# Patient Record
Sex: Male | Born: 1935 | Race: White | Hispanic: No | Smoking: Never smoker
Health system: Southern US, Community
[De-identification: ages and names within clinical notes are randomized; demographics above are authoritative.]

## PROBLEM LIST (undated history)

## (undated) DIAGNOSIS — N189 Chronic kidney disease, unspecified: Secondary | ICD-10-CM

## (undated) DIAGNOSIS — E785 Hyperlipidemia, unspecified: Secondary | ICD-10-CM

## (undated) DIAGNOSIS — C801 Malignant (primary) neoplasm, unspecified: Secondary | ICD-10-CM

## (undated) DIAGNOSIS — M791 Myalgia, unspecified site: Secondary | ICD-10-CM

## (undated) DIAGNOSIS — I10 Essential (primary) hypertension: Secondary | ICD-10-CM

## (undated) DIAGNOSIS — K219 Gastro-esophageal reflux disease without esophagitis: Secondary | ICD-10-CM

## (undated) DIAGNOSIS — I480 Paroxysmal atrial fibrillation: Secondary | ICD-10-CM

## (undated) DIAGNOSIS — H409 Unspecified glaucoma: Secondary | ICD-10-CM

## (undated) DIAGNOSIS — S065XAA Traumatic subdural hemorrhage with loss of consciousness status unknown, initial encounter: Secondary | ICD-10-CM

## (undated) DIAGNOSIS — N183 Chronic kidney disease, stage 3 unspecified: Secondary | ICD-10-CM

## (undated) DIAGNOSIS — Z7901 Long term (current) use of anticoagulants: Secondary | ICD-10-CM

## (undated) DIAGNOSIS — K5790 Diverticulosis of intestine, part unspecified, without perforation or abscess without bleeding: Secondary | ICD-10-CM

## (undated) DIAGNOSIS — N400 Enlarged prostate without lower urinary tract symptoms: Secondary | ICD-10-CM

## (undated) DIAGNOSIS — E119 Type 2 diabetes mellitus without complications: Secondary | ICD-10-CM

## (undated) DIAGNOSIS — R001 Bradycardia, unspecified: Secondary | ICD-10-CM

## (undated) DIAGNOSIS — Z87442 Personal history of urinary calculi: Secondary | ICD-10-CM

## (undated) DIAGNOSIS — C4491 Basal cell carcinoma of skin, unspecified: Secondary | ICD-10-CM

## (undated) DIAGNOSIS — I700 Atherosclerosis of aorta: Secondary | ICD-10-CM

## (undated) DIAGNOSIS — T466X5A Adverse effect of antihyperlipidemic and antiarteriosclerotic drugs, initial encounter: Secondary | ICD-10-CM

## (undated) HISTORY — PX: COLONOSCOPY W/ POLYPECTOMY: SHX1380

## (undated) HISTORY — PX: VASECTOMY: SHX75

## (undated) HISTORY — PX: TONSILLECTOMY: SUR1361

---

## 2003-08-18 ENCOUNTER — Other Ambulatory Visit: Payer: Self-pay

## 2005-10-02 HISTORY — PX: SUBDURAL HEMATOMA EVACUATION VIA CRANIOTOMY: SUR319

## 2006-01-08 ENCOUNTER — Other Ambulatory Visit: Payer: Self-pay

## 2006-01-08 ENCOUNTER — Observation Stay: Payer: Self-pay | Admitting: Internal Medicine

## 2006-05-24 ENCOUNTER — Ambulatory Visit: Payer: Self-pay | Admitting: Family Medicine

## 2006-07-02 ENCOUNTER — Ambulatory Visit: Payer: Self-pay | Admitting: Gastroenterology

## 2009-07-15 ENCOUNTER — Ambulatory Visit: Payer: Self-pay | Admitting: Otolaryngology

## 2009-11-30 ENCOUNTER — Ambulatory Visit: Payer: Self-pay | Admitting: Gastroenterology

## 2010-05-03 ENCOUNTER — Ambulatory Visit: Payer: Self-pay | Admitting: Otolaryngology

## 2010-05-12 ENCOUNTER — Ambulatory Visit: Payer: Self-pay | Admitting: Otolaryngology

## 2010-06-16 ENCOUNTER — Ambulatory Visit: Payer: Self-pay | Admitting: Otolaryngology

## 2011-02-11 ENCOUNTER — Emergency Department: Payer: Self-pay | Admitting: Emergency Medicine

## 2012-06-27 ENCOUNTER — Ambulatory Visit: Payer: Self-pay | Admitting: General Surgery

## 2012-06-27 LAB — BASIC METABOLIC PANEL
BUN: 19 mg/dL — ABNORMAL HIGH (ref 7–18)
Creatinine: 1.24 mg/dL (ref 0.60–1.30)
EGFR (African American): >60
EGFR (Non-African Amer.): 55 — ABNORMAL LOW
Glucose: 128 mg/dL — ABNORMAL HIGH (ref 65–99)
Osmolality: 281 (ref 275–301)
Potassium: 4.3 mmol/L (ref 3.5–5.1)
Sodium: 139 mmol/L (ref 136–145)

## 2012-07-09 ENCOUNTER — Ambulatory Visit: Payer: Self-pay | Admitting: General Surgery

## 2013-01-28 ENCOUNTER — Ambulatory Visit: Payer: Self-pay | Admitting: Family Medicine

## 2013-01-28 ENCOUNTER — Inpatient Hospital Stay: Payer: Self-pay | Admitting: Family Medicine

## 2013-01-29 LAB — COMPREHENSIVE METABOLIC PANEL
Alkaline Phosphatase: 64 U/L (ref 50–136)
Bilirubin,Total: 1.8 mg/dL — ABNORMAL HIGH (ref 0.2–1.0)
Co2: 23 mmol/L (ref 21–32)
EGFR (African American): >60
EGFR (Non-African Amer.): 54 — ABNORMAL LOW
Glucose: 115 mg/dL — ABNORMAL HIGH (ref 65–99)
SGOT(AST): 18 U/L (ref 15–37)
Sodium: 138 mmol/L (ref 136–145)
Total Protein: 6.3 g/dL — ABNORMAL LOW (ref 6.4–8.2)

## 2013-01-29 LAB — CBC WITH DIFFERENTIAL/PLATELET
Basophil #: 0 10*3/uL (ref 0.0–0.1)
Eosinophil #: 0.1 10*3/uL (ref 0.0–0.7)
HCT: 34.9 % — ABNORMAL LOW (ref 40.0–52.0)
Lymphocyte #: 1.5 10*3/uL (ref 1.0–3.6)
MCHC: 34.8 g/dL (ref 32.0–36.0)
MCV: 94 fL (ref 80–100)
Platelet: 97 10*3/uL — ABNORMAL LOW (ref 150–440)
RBC: 3.71 10*6/uL — ABNORMAL LOW (ref 4.40–5.90)
RDW: 13.6 % (ref 11.5–14.5)
WBC: 10.9 10*3/uL — ABNORMAL HIGH (ref 3.8–10.6)

## 2013-01-30 LAB — CBC WITH DIFFERENTIAL/PLATELET
Basophil #: 0 10*3/uL (ref 0.0–0.1)
Basophil %: 0.6 %
Eosinophil #: 0.3 10*3/uL (ref 0.0–0.7)
Eosinophil %: 4.7 %
HCT: 33.8 % — ABNORMAL LOW (ref 40.0–52.0)
HGB: 11.6 g/dL — ABNORMAL LOW (ref 13.0–18.0)
Lymphocyte %: 24.1 %
MCH: 32.6 pg (ref 26.0–34.0)
MCHC: 34.3 g/dL (ref 32.0–36.0)
MCV: 95 fL (ref 80–100)
Monocyte #: 0.6 x10 3/mm (ref 0.2–1.0)
Neutrophil %: 62.4 %
Platelet: 105 10*3/uL — ABNORMAL LOW (ref 150–440)
RDW: 14 % (ref 11.5–14.5)
WBC: 6.9 10*3/uL (ref 3.8–10.6)

## 2013-01-30 LAB — BASIC METABOLIC PANEL
Anion Gap: 4 — ABNORMAL LOW (ref 7–16)
BUN: 16 mg/dL (ref 7–18)
Calcium, Total: 8.1 mg/dL — ABNORMAL LOW (ref 8.5–10.1)
Co2: 25 mmol/L (ref 21–32)
Creatinine: 1.19 mg/dL (ref 0.60–1.30)
Glucose: 81 mg/dL (ref 65–99)
Potassium: 4 mmol/L (ref 3.5–5.1)

## 2013-01-31 LAB — CBC WITH DIFFERENTIAL/PLATELET
Basophil #: 0 10*3/uL (ref 0.0–0.1)
Basophil %: 0.4 %
Eosinophil %: 5.9 %
HCT: 33.9 % — ABNORMAL LOW (ref 40.0–52.0)
HGB: 11.8 g/dL — ABNORMAL LOW (ref 13.0–18.0)
Lymphocyte #: 1.5 10*3/uL (ref 1.0–3.6)
Lymphocyte %: 28.1 %
MCH: 33.1 pg (ref 26.0–34.0)
MCV: 95 fL (ref 80–100)
Monocyte #: 0.5 x10 3/mm (ref 0.2–1.0)
Monocyte %: 8.7 %
Neutrophil #: 3.1 10*3/uL (ref 1.4–6.5)
Platelet: 114 10*3/uL — ABNORMAL LOW (ref 150–440)
RBC: 3.57 10*6/uL — ABNORMAL LOW (ref 4.40–5.90)
WBC: 5.4 10*3/uL (ref 3.8–10.6)

## 2013-01-31 LAB — BASIC METABOLIC PANEL
Anion Gap: 7 (ref 7–16)
Calcium, Total: 8.5 mg/dL (ref 8.5–10.1)
EGFR (African American): >60
EGFR (Non-African Amer.): >60
Sodium: 142 mmol/L (ref 136–145)

## 2014-08-21 DIAGNOSIS — I38 Endocarditis, valve unspecified: Secondary | ICD-10-CM

## 2014-08-21 HISTORY — DX: Endocarditis, valve unspecified: I38

## 2015-01-19 NOTE — Op Note (Signed)
PATIENT NAME:  Craig [name], Craig [name] MR#:  [id number] DATE OF BIRTH:  Jun 22, 1932  DATE OF PROCEDURE:  07/09/2012  PREOPERATIVE DIAGNOSIS: Right inguinal hernia.   POSTOPERATIVE DIAGNOSIS: Right inguinal hernia.   OPERATIVE PROCEDURE: Right indirect inguinal hernia repair with medium Ultrapro mesh.   OPERATING SURGEON: Robert Bellow, MD   ANESTHESIA: General under Dr. Marcene Brawn, Marcaine 0.5% plain, 30 mL local infiltration, Toradol 30 mg.   ESTIMATED BLOOD LOSS: Less than 5 mL.   CLINICAL NOTE: This 81 year old male has developed a symptomatic right inguinal hernia. He was admitted for elective repair. He received Kefzol prior to the procedure. Hair was removed with clippers.   OPERATIVE NOTE: The patient was brought in the operating room and placed in the supine position. He underwent general anesthesia without difficulty. The area was prepped with ChloraPrep and draped. Field block anesthesia was established for postoperative analgesia. A 5 cm skin line incision was made along the anticipated course of the inguinal canal and carried down through the skin and subcutaneous tissue with hemostasis achieved by electrocautery. The external oblique was opened in the direction of its fibers. The ilioinguinal and iliohypogastric nerves were identified and protected. The cremasteric fibers were split and a generous indirect sac was tracked down to the top of the scrotum. This was brought back to the internal ring. There was a small rent in the sac and for that reason the sac was twisted upon itself, transfixed with a 2-0 Vicryl suture ligature followed by a 2-0 Vicryl tie, excised and discarded. The preperitoneal space was cleared and a medium Ultrapro mesh was placed. The external component was laid along the floor of the inguinal canal. It was stitched to the pubic tubercle and then along the inguinal ligament with interrupted 0 Surgilon sutures. The medial and superior borders were anchored to the transverse  abdominis aponeurosis. Toradol was placed into the wound for postoperative analgesia. The external oblique was closed with running 2-0 Vicryl, Scarpa's fascia closed with a running 3-0 Vicryl, and the skin closed with a running 4-0 Vicryl subcuticular suture. Benzoin, Steri-Strips, Telfa, and Tegaderm dressing was then applied.   The patient tolerated the procedure well and was brought to the recovery room in stable condition.   ____________________________ Robert Bellow, MD jwb:drc D: 07/09/2012 10:35:02 ET T: 07/09/2012 10:51:28 ET JOB#: [id number]  cc: Robert Bellow, MD, <Dictator> Milinda Pointer. Jacqualine Code, MD Harriette Tovey Amedeo Kinsman MD ELECTRONICALLY SIGNED 07/15/2012 12:37

## 2015-01-22 NOTE — Consult Note (Signed)
PATIENT NAME:  Craig [name], Craig [name] MR#:  [id number] DATE OF BIRTH:  1932-02-27  DATE OF CONSULT:  01/28/2013  ATTENDING PHYSICIAN:  Harrell Gave A. Pressley Tadesse, MD  REASON FOR CONSULT:  Suprapubic pain x 3 days.   HISTORY OF PRESENT ILLNESS: The patient is a pleasant, 81 year old male with a history of diverticulitis, diabetes and colon polyps, who presented to Dr. Raylene Miyamoto clinic today with suprapubic pain x 3 days. He says that over the last day it has gotten much worse, and he finds himself doubled over with cramping. He also gets the sensation of pressure and need to have a bowel movement, but only passes gas at that time. Does have subjective chills. No fever. Normal appetite. No nausea, vomiting, diarrhea, constipation. Last bowel movement was yesterday. He does have a history of normal bowel movements. He had a CT scan, which showed diverticulitis without intra-abdominal free air. No fevers, chest pain, shortness of breath, cough, dysuria, hematuria. He was admitted from clinic for hospitalization, IV antibiotics, and made n.p.o.   PAST MEDICAL HISTORY:  1.  Diverticulitis, 2 bouts total, last bout 8 to 10 years ago. Has never been admitted for diverticulitis, nor had CT before.  2.  History of colon polyps. Last colonoscopy in 2011 showed diverticulosis, with no obvious polyps. Followup recommendation was to be in 5 years.  3.  Hypertension.  4.  Hyperlipidemia.  5.  Diabetes mellitus.  6.  History of nephrolithiasis.  7.  BPH.   8.  History of traumatic subdural hematoma in 2005.  9.  History of statin-induced myalgia.  10.  History of vasectomy.  11.  History of right face basal cell carcinoma.  12.  History of tonsillectomy.   HOME MEDICATIONS: 1.  Hydrochlorothiazide.  2.  Metformin.  3.  Terazosin.  4.  Lisinopril.  5.  Crestor.  6.  Centrum silver.  7.  Glimepiride.    ALLERGIES:  1.  NIACIN, which causes a rash.  2.  History of INTOLERANCE TO STATINS, except for Crestor.    FAMILY HISTORY:  Relatively negative. No history of heart disease, diabetes or cancer. Mother did have hypertension.   SOCIAL HISTORY:  He is married, lives with his wife. Is retired. No tobacco use. Has 3 to 4 drinks a week. No illegal drug use.   REVIEW OF SYSTEMS: A total of 12-point review of systems was obtained. Pertinent positives and negatives as above.   PHYSICAL EXAMINATION:  VITAL SIGNS:  Temperature 99.6, pulse 77, blood pressure 136/71, respirations 18, 95% on room air.  GENERAL:  No acute distress. Alert and oriented x 3.  EYES:  No scleral icterus. No conjunctivitis.  FACE:  No obvious facial trauma. Normal external nose. Normal external ears.  CHEST:  Lungs clear to auscultation, moving air well.  HEART:  Regular rate and rhythm. No murmurs, rubs or gallops.  ABDOMEN:  Soft, nondistended. Quite tender in suprapubic region, not tender elsewhere in abdomen, plus rebound.  EXTREMITIES:  Moves all extremities well. Strength 5 out of 5.  NEUROLOGIC: Cranial nerves II through XII grossly intact, neurologically intact all 4 extremities.   LABS:  Labs are pending.   CT scan shows a sigmoid diverticulitis with bowel wall thickening and inflammatory changes in the surrounding fat, possible contained perforation as indicated by a little foci of air.   ASSESSMENT AND PLAN: The patient is a pleasant, 81 year old male with recurrent diverticulitis. However, last bout was 8 to 10 years ago. He is up to date on  colonoscopy. Would recommend IV and n.p.o. Will follow up on labs.   Will continue to follow with you. No obvious surgical intervention necessary at this time.    ____________________________ Glena Norfolk Wentworth Edelen, MD cal:mr D: 01/28/2013 18:59:00 ET T: 01/28/2013 19:17:11 ET JOB#: [id number]  cc: Harrell Gave A. Sven Pinheiro, MD, <Dictator> Floyde Parkins MD ELECTRONICALLY SIGNED 01/31/2013 19:18

## 2015-01-22 NOTE — H&P (Signed)
PATIENT NAME:  Craig [name], Craig [name] MR#:  [id number] DATE OF BIRTH:  [birth year]  DATE OF ADMISSION:  01/28/2013  CHIEF COMPLAINT: Lower abdominal pain.   HISTORY OF PRESENT ILLNESS: This is a 81 year old male who presented to the clinic today with complaints of lower abdominal pain x 3 days, acutely worsening over the past day and a half. No fever. No nausea, vomiting, diarrhea or rectal bleeding. His last bowel movement was yesterday. No history of trauma or injury. On exam in the clinic, he was noted to have rebound and guarding and was severely tender in the right lower quadrant. Therefore, he was sent over to radiology for a CT of the abdomen. I spoke to Dr. Orson Ape who stated that the patient has obvious diverticulitis and also foci of air concerning for bowel perforation. After speaking with Dr. Rochel Brome, he recommended hospitalization, IV antibiotics and n.p.o., and surgical evaluation recommended during hospitalization. The patient was admitted from the clinic.   PAST MEDICAL HISTORY:  1.  Hypertension.  2.  Hyperlipidemia.  3.  Adult-onset diabetes.  4.  History of nephrolithiasis.  5.  BPH.  6.  History of traumatic subdural hematoma in 2005.  7.  Statin-induced myalgia.  8.  History of adenomatous polyp.  9.  History of diverticulosis.   PAST SURGICAL HISTORY:  1.  Evacuation of right-sided subdural hematoma in 2005.  2.  Vasectomy.  3.  Right face basal cell carcinoma removal.  4.  Tonsillectomy.   MEDICATIONS:  1.  HCTZ 12.5 mg p.o. daily.  2.  Metformin 500 mg extended-release 1 tab at bedtime.  3.  Terazosin 10 mg p.o. daily.  4.  Lisinopril 20 mg p.o. daily.  5.  Crestor 5 mg 1/2 tab p.o. 3 times a week, Monday, Wednesday, Friday.  6.  Centrum Silver 1 tab daily.  7.  Glimepiride 2 mg p.o. daily.   ALLERGIES: NIACIN, WHICH CAUSES A RASH. HISTORY OF INTOLERANCE TO STATINS, EXCEPT FOR THE CRESTOR.   FAMILY HISTORY: Noncontributory.   SOCIAL HISTORY: He is married and lives  with his wife. He is retired. No tobacco use. No drug use. Occasional alcohol intake. No supplementation.   PREVENTATIVE HEALTH HISTORY. He is up-to-date on his tetanus, April 2013. Up-to-date on his Pneumovax, April. Up-to-date on his influenza, September 2013. Last colonoscopy in March 2011 showed diverticulosis in sigmoid colon per Dr. Candace Cruise with instructions to repeat in 5 years.  CODE STATUS: Full code. POA is Salif Tay, his son, and has a living will in place.   REVIEW OF SYSTEMS: As stated above in HPI.   PHYSICAL EXAMINATION:   VITAL SIGNS: In the office, temperature of 98.5, pulse 90, blood pressure 146/92, height 68 inches, weight 182.4, respiratory rate within normal limits.  GENERAL: This is a well appearing elderly male, alert and oriented x 3, in no apparent distress.  HEENT: Extraocular movements are intact. Moist mucous membranes.  NECK: Supple.  CARDIOVASCULAR: Regular rate and rhythm.  RESPIRATORY: No increased work of breathing. ABDOMEN: Consistent with severe tenderness to palpation of the right lower quadrant and lower mid abdomen with rebound and guarding present.  SKIN: No skin changes noted. Normal color and turgor. NEUROLOGIC: No acute neurological deficits seen.   PERTINENT LABORATORY AND RADIOLOGICAL DATA: The patient had labs performed in the clinic. White blood cell count 12.3, hemoglobin 14.2 and platelet count of 117. Sodium 136, potassium 4.2, creatinine 1.3, glucose 225. AST, ALT all within normal limits, alkaline phosphatase 52 and total bilirubin  1.4. Preliminary CT of the abdomen, as stated above in HPI, did show diverticulitis with concerns for bowel perforation.   ASSESSMENT AND PLAN: This is a 81 year old male being admitted with acute abdominal pain consistent with diverticulitis and concern for a bowel perforation.  1.  Acute abdominal discomfort: The patient has a CT of the abdomen that is consistent with diverticulitis and concern for possible bowel  perforation. After consulting and talking with Dr. Rochel Brome, he did recommend admission but no immediate surgical intervention at this time, awaiting the final read on the CT. The patient is hemodynamically stable and in no distress. Plan for intravenous antibiotics via Zosyn every 6 hours. Also plan on intravenous fluids and nothing by mouth at this time. We will continue with home blood pressure medications as tolerated. Surgery has been consulted during his hospital stay. We will repeat a white blood cell count. Follow vital signs and clinical status. Has Tylenol for fever and will order morphine as needed for pain.  2.  Hypertension: Blood pressure stable. Continue his home blood pressure medications as tolerated.  3.  Hyperlipidemia: Will hold on the Crestor at this time.  4.  Adult-onset diabetes: Will hold the metformin and glimepiride. Recent CT study performed with contrast.  5.  Benign prostatic hypertrophy: Will continue with his home terazosin. 6.  Fluids, electrolytes, nutrition: Nothing by mouth. Place on normal saline 125 mL/hour. Follow electrolytes accordingly. 7.  Prophylaxis: Will place on Lovenox.   DISPOSITION: He is being admitted to the medical floor, inpatient status.   CODE STATUS: He is a full code.   ____________________________ Dion Body, MD kl:jm D: 01/28/2013 16:24:57 ET T: 01/28/2013 17:01:22 ET JOB#: [id number]  cc: Dion Body, MD, <Dictator> Dion Body MD ELECTRONICALLY SIGNED 02/05/2013 16:50

## 2015-01-22 NOTE — Discharge Summary (Signed)
PATIENT NAME:  Craig [name], Craig [name] MR#:  [id number] DATE OF BIRTH:  1931-10-07  DATE OF ADMISSION:  01/28/2013 DATE OF DISCHARGE: 01/31/2013    DISCHARGE DIAGNOSES:  1. Acute diverticulitis.  2. Hypertension.  3. Hyperlipidemia.  4. Adult-onset diabetes.  5. Benign prostatic hypertrophy.   DISCHARGE MEDICATIONS:  1. Hydrochlorothiazide 12.5 mg p.o. daily.  2. Metformin 500 mg extended release 1 tab p.o. with dinner.  3. Terazosin 10 mg p.o. daily.  4. Lisinopril 20 mg p.o. daily.  5. Crestor 5 mg 1/2-tab 3 times a week.  6. Centrum Silver as directed. 7. Glimepiride 2 mg p.o. daily.  8. Amoxicillin 875 p.o. b.i.d. for 9 more days.   CONSULTS: Surgery.   PROCEDURES: None.   PERTINENT STUDIES:  1. CT of the abdomen was consistent with acute diverticulitis with small perforation possible. Seen to have a foci of air in the bowel.  2. On the day of discharge, sodium 142, potassium 4.2, creatinine 1, glucose 104. White blood cell count 5.4, hemoglobin 11.8 and platelets 114.   BRIEF HOSPITAL COURSE:  1. Acute abdominal pain. The patient initially came in with acute lower abdominal pain. CT of the abdomen was consistent with acute diverticulitis, but also was concerning for bowel perforation with a small area of foci of air in the bowel. Surgery was consulted and did not think any surgical intervention was needed. The patient clinically was improving throughout the day, was tolerating p.o. and abdominal pain improved, consistent with treatment of the diverticulitis. He was initially placed on Zosyn for 2 days and transitioned over to Augmentin, which he tolerated very well. He has continued to have some loose stools, but otherwise clinically improved. He will complete a 10-day course of the Augmentin. No further imaging recommended by surgery.  2. Other chronic medical issues remained stable. Continue on home regimen.   DISPOSITION: He is in stable condition and will be discharged to home.    FOLLOWUP: Follow up with Dr. Netty Starring in 10 days.   ____________________________ Dion Body, MD kl:OSi D: 01/31/2013 08:13:30 ET T: 01/31/2013 09:02:00 ET JOB#: [id number]  cc: Dion Body, MD, <Dictator> Dion Body MD ELECTRONICALLY SIGNED 02/05/2013 16:50

## 2015-04-01 IMAGING — CT CT ABD-PELV W/ CM
1 of 2 series · 15 of 32 positions shown, 19 images · IV contrast (isovue)
Comparison: none

REASON FOR EXAM: Metformin Call Report pager [phone]  RLQ abd pain
COMMENTS:

PROCEDURE:     CT  - CT ABDOMEN / PELVIS  W  - January 28, 2013  [DATE]
RESULT:     Comparison:  None
TECHNIQUE: Multiple axial images of the abdomen and pelvis were performed
from the lung bases to the pubic symphysis, with p.o. contrast and with 100
mL of Isovue 300 intravenous contrast.

[Series 2: 3mm soft tissue · axial · 0.76mm/px · z∈[-1010,-575]mm · 15 of 159 slices shown, 19 images]
[im 7/159  soft-tissue]
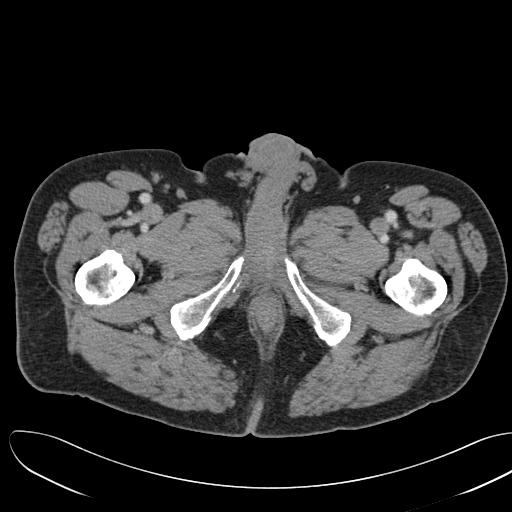
[im 7/159  bone]
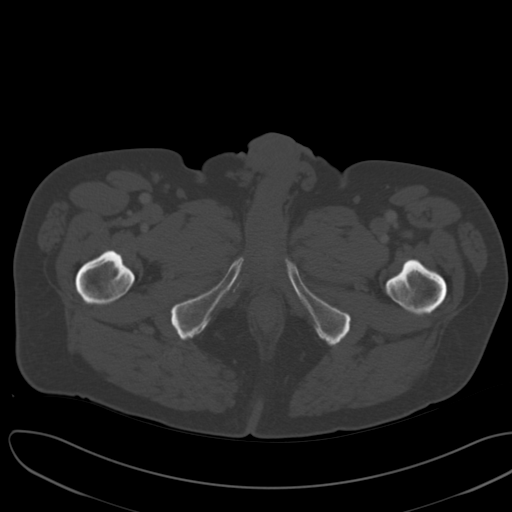
[im 20/159  soft-tissue]
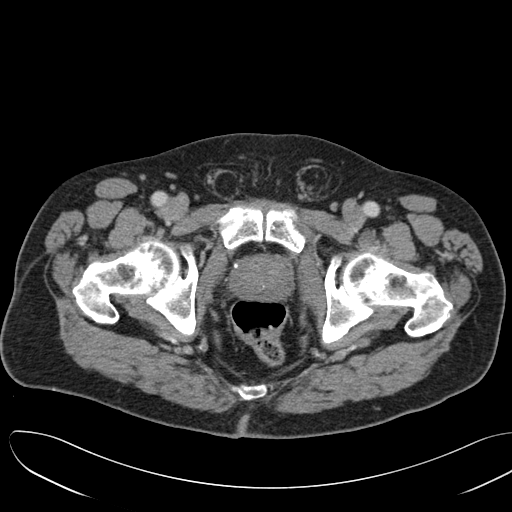
[im 33/159  soft-tissue]
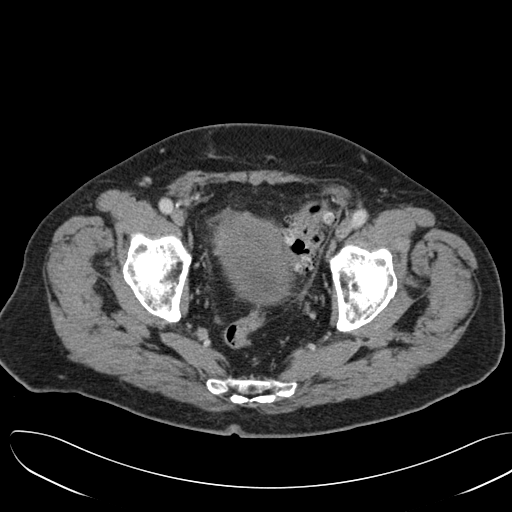
[im 47/159  soft-tissue]
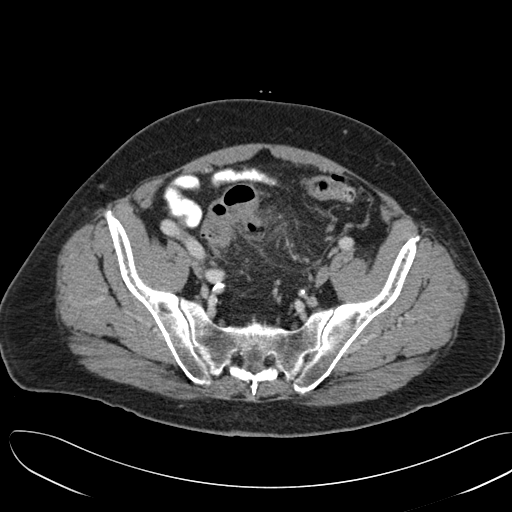
[im 53/159  soft-tissue]
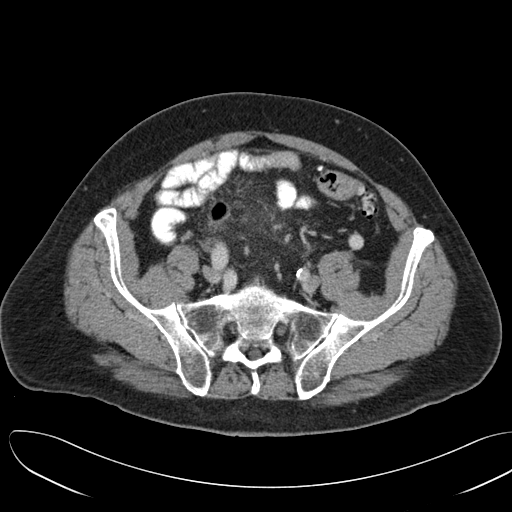
[im 66/159  soft-tissue]
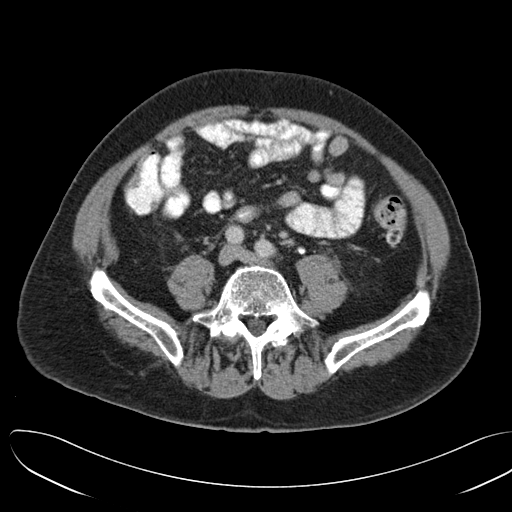
[im 80/159  soft-tissue]
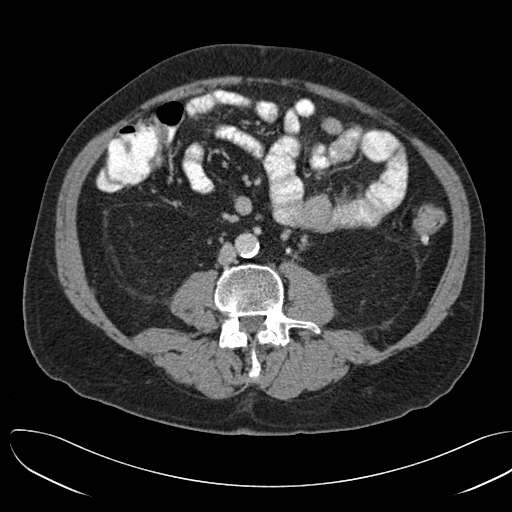
[im 93/159  soft-tissue]
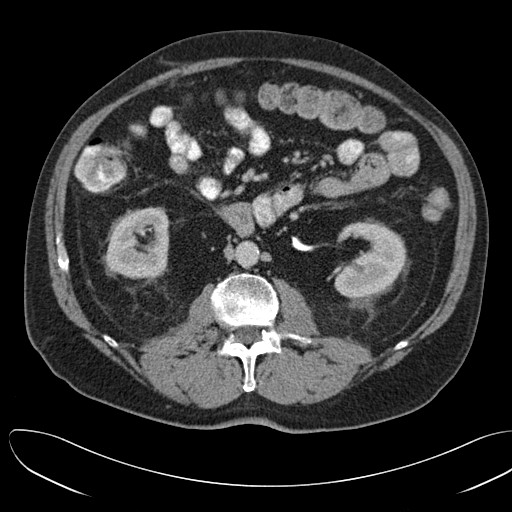
[im 106/159  soft-tissue]
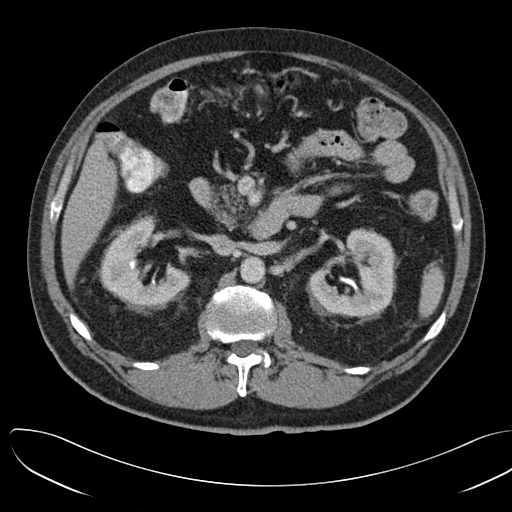
[im 106/159  bone]
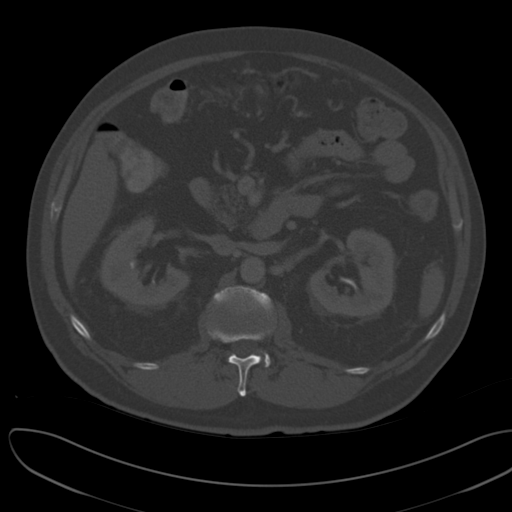
[im 112/159  soft-tissue]
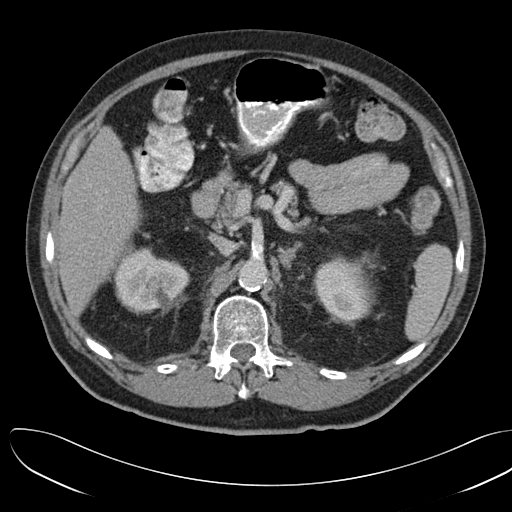
[im 126/159  soft-tissue]
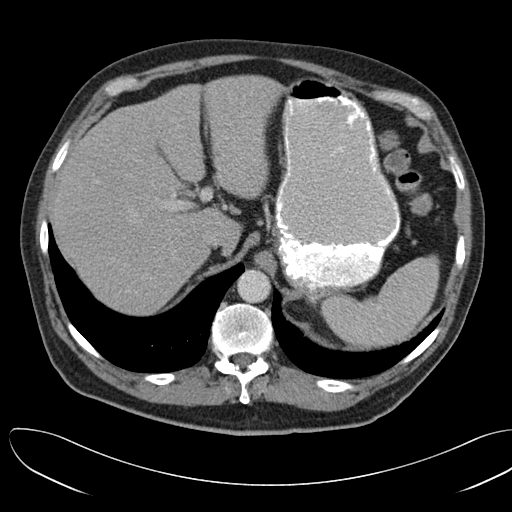
[im 132/159  lung]
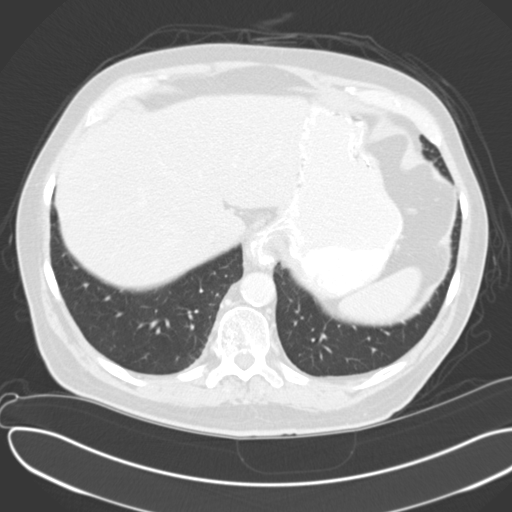
[im 139/159  soft-tissue]
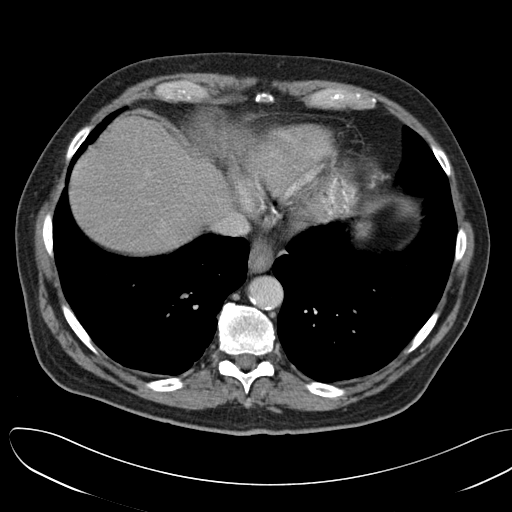
[im 139/159  lung]
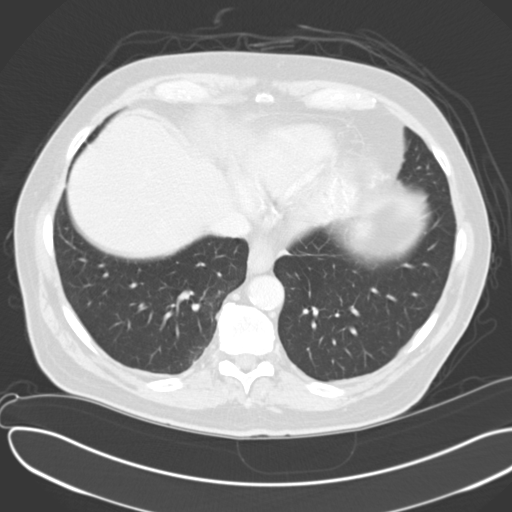
[im 145/159  lung]
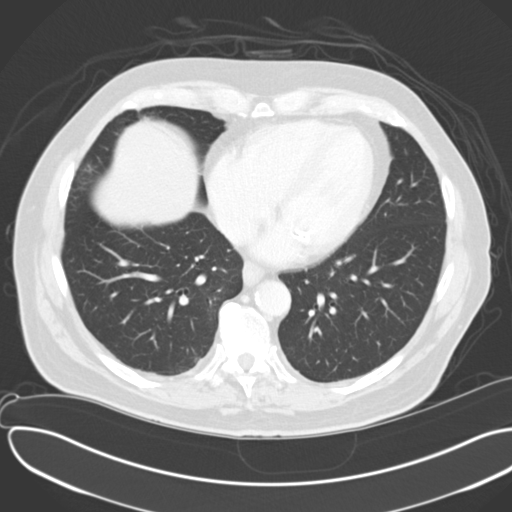
[im 152/159  soft-tissue]
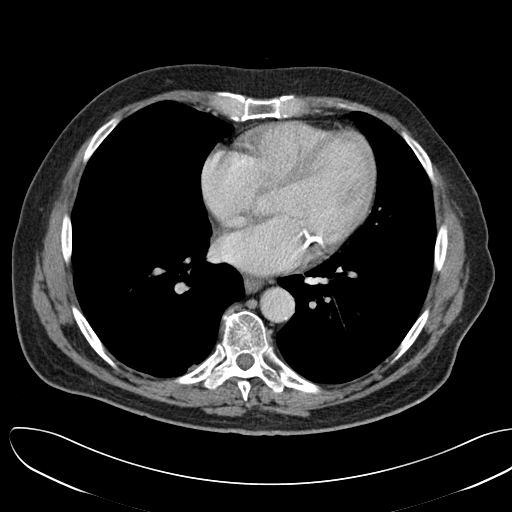
[im 152/159  lung]
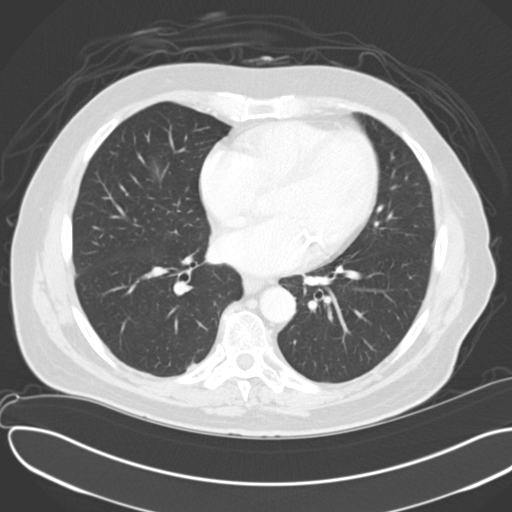

[15 of 32 positions shown; findings below may reference images not displayed]

FINDINGS: The liver, gallbladder, spleen, adrenals, and pancreas are unremarkable.
There is a possible small hiatal hernia. Calcifications are seen in the
coronary arteries.

There is mild perinephric stranding, which is nonspecific. A small amount of
excreted contrast material is seen in the renal collecting systems
bilaterally. Small low-attenuation lesion in the right kidney is too small
to characterize. No hydronephrosis.

The small and large bowel are normal in caliber. There is diverticulosis of
the sigmoid colon. There is bowel wall thickening and mild adjacent
inflammatory changes involving the sigmoid colon. There are a a few small
foci of air adjacent to sigmoid colon which are concerning for contained
perforation versus less likely air within a large diverticulum. No discrete
extraluminal fluid collection identified. There is a mildly prominent degree
of fat within the inguinal canals, left greater than right. There is mild
diverticulosis of the descending colon. The appendix is normal.

No aggressive lytic or sclerotic osseous lesions are identified.
IMPRESSION: There are findings of acute sigmoid diverticulitis. Small foci of adjacent
air are concerning for a small focus of contained perforation versus less
likely air within a large diverticulum. No discrete extraluminal fluid
collection identified.

Colonoscopy is suggested after the acute episode, as malignancy can have a
similar appearance.

This was discussed with Dr. Kaki Jim at 4252 hours 01/28/2013.

## 2015-11-03 DIAGNOSIS — C44311 Basal cell carcinoma of skin of nose: Secondary | ICD-10-CM | POA: Diagnosis not present

## 2015-11-03 DIAGNOSIS — Z79899 Other long term (current) drug therapy: Secondary | ICD-10-CM | POA: Diagnosis not present

## 2015-12-15 DIAGNOSIS — L111 Transient acantholytic dermatosis [Grover]: Secondary | ICD-10-CM | POA: Diagnosis not present

## 2015-12-15 DIAGNOSIS — C44311 Basal cell carcinoma of skin of nose: Secondary | ICD-10-CM | POA: Diagnosis not present

## 2015-12-15 DIAGNOSIS — D485 Neoplasm of uncertain behavior of skin: Secondary | ICD-10-CM | POA: Diagnosis not present

## 2016-01-10 DIAGNOSIS — Z85828 Personal history of other malignant neoplasm of skin: Secondary | ICD-10-CM | POA: Diagnosis not present

## 2016-01-10 DIAGNOSIS — C44311 Basal cell carcinoma of skin of nose: Secondary | ICD-10-CM | POA: Diagnosis not present

## 2016-01-28 DIAGNOSIS — Z85828 Personal history of other malignant neoplasm of skin: Secondary | ICD-10-CM | POA: Diagnosis not present

## 2016-01-28 DIAGNOSIS — C44311 Basal cell carcinoma of skin of nose: Secondary | ICD-10-CM | POA: Diagnosis not present

## 2016-01-28 DIAGNOSIS — X32XXXA Exposure to sunlight, initial encounter: Secondary | ICD-10-CM | POA: Diagnosis not present

## 2016-01-28 DIAGNOSIS — L570 Actinic keratosis: Secondary | ICD-10-CM | POA: Diagnosis not present

## 2016-01-28 DIAGNOSIS — D485 Neoplasm of uncertain behavior of skin: Secondary | ICD-10-CM | POA: Diagnosis not present

## 2016-01-28 DIAGNOSIS — Z08 Encounter for follow-up examination after completed treatment for malignant neoplasm: Secondary | ICD-10-CM | POA: Diagnosis not present

## 2016-02-07 DIAGNOSIS — R1031 Right lower quadrant pain: Secondary | ICD-10-CM | POA: Diagnosis not present

## 2016-02-07 DIAGNOSIS — R1032 Left lower quadrant pain: Secondary | ICD-10-CM | POA: Diagnosis not present

## 2016-02-08 DIAGNOSIS — I10 Essential (primary) hypertension: Secondary | ICD-10-CM | POA: Diagnosis not present

## 2016-02-08 DIAGNOSIS — E119 Type 2 diabetes mellitus without complications: Secondary | ICD-10-CM | POA: Diagnosis not present

## 2016-02-08 DIAGNOSIS — E7800 Pure hypercholesterolemia, unspecified: Secondary | ICD-10-CM | POA: Diagnosis not present

## 2016-02-08 DIAGNOSIS — D693 Immune thrombocytopenic purpura: Secondary | ICD-10-CM | POA: Diagnosis not present

## 2016-02-11 DIAGNOSIS — N289 Disorder of kidney and ureter, unspecified: Secondary | ICD-10-CM | POA: Diagnosis not present

## 2016-02-15 DIAGNOSIS — Z0000 Encounter for general adult medical examination without abnormal findings: Secondary | ICD-10-CM | POA: Diagnosis not present

## 2016-02-15 DIAGNOSIS — I10 Essential (primary) hypertension: Secondary | ICD-10-CM | POA: Diagnosis not present

## 2016-02-15 DIAGNOSIS — E119 Type 2 diabetes mellitus without complications: Secondary | ICD-10-CM | POA: Diagnosis not present

## 2016-02-15 DIAGNOSIS — D693 Immune thrombocytopenic purpura: Secondary | ICD-10-CM | POA: Diagnosis not present

## 2016-02-15 DIAGNOSIS — E871 Hypo-osmolality and hyponatremia: Secondary | ICD-10-CM | POA: Diagnosis not present

## 2016-02-15 DIAGNOSIS — E7800 Pure hypercholesterolemia, unspecified: Secondary | ICD-10-CM | POA: Diagnosis not present

## 2016-02-21 DIAGNOSIS — E871 Hypo-osmolality and hyponatremia: Secondary | ICD-10-CM | POA: Diagnosis not present

## 2016-03-27 DIAGNOSIS — C44319 Basal cell carcinoma of skin of other parts of face: Secondary | ICD-10-CM | POA: Diagnosis not present

## 2016-03-27 DIAGNOSIS — L905 Scar conditions and fibrosis of skin: Secondary | ICD-10-CM | POA: Diagnosis not present

## 2016-03-27 DIAGNOSIS — Z85828 Personal history of other malignant neoplasm of skin: Secondary | ICD-10-CM | POA: Diagnosis not present

## 2016-05-16 DIAGNOSIS — C44219 Basal cell carcinoma of skin of left ear and external auricular canal: Secondary | ICD-10-CM | POA: Diagnosis not present

## 2016-05-16 DIAGNOSIS — Z08 Encounter for follow-up examination after completed treatment for malignant neoplasm: Secondary | ICD-10-CM | POA: Diagnosis not present

## 2016-05-16 DIAGNOSIS — C44311 Basal cell carcinoma of skin of nose: Secondary | ICD-10-CM | POA: Diagnosis not present

## 2016-05-16 DIAGNOSIS — D485 Neoplasm of uncertain behavior of skin: Secondary | ICD-10-CM | POA: Diagnosis not present

## 2016-05-16 DIAGNOSIS — L570 Actinic keratosis: Secondary | ICD-10-CM | POA: Diagnosis not present

## 2016-05-16 DIAGNOSIS — Z85828 Personal history of other malignant neoplasm of skin: Secondary | ICD-10-CM | POA: Diagnosis not present

## 2016-05-16 DIAGNOSIS — X32XXXA Exposure to sunlight, initial encounter: Secondary | ICD-10-CM | POA: Diagnosis not present

## 2016-05-30 DIAGNOSIS — C44311 Basal cell carcinoma of skin of nose: Secondary | ICD-10-CM | POA: Diagnosis not present

## 2016-07-05 DIAGNOSIS — L905 Scar conditions and fibrosis of skin: Secondary | ICD-10-CM | POA: Diagnosis not present

## 2016-07-05 DIAGNOSIS — C44219 Basal cell carcinoma of skin of left ear and external auricular canal: Secondary | ICD-10-CM | POA: Diagnosis not present

## 2016-07-12 DIAGNOSIS — C44311 Basal cell carcinoma of skin of nose: Secondary | ICD-10-CM | POA: Diagnosis not present

## 2016-08-10 DIAGNOSIS — D693 Immune thrombocytopenic purpura: Secondary | ICD-10-CM | POA: Diagnosis not present

## 2016-08-10 DIAGNOSIS — E7800 Pure hypercholesterolemia, unspecified: Secondary | ICD-10-CM | POA: Diagnosis not present

## 2016-08-10 DIAGNOSIS — I10 Essential (primary) hypertension: Secondary | ICD-10-CM | POA: Diagnosis not present

## 2016-08-10 DIAGNOSIS — E119 Type 2 diabetes mellitus without complications: Secondary | ICD-10-CM | POA: Diagnosis not present

## 2016-08-17 DIAGNOSIS — I340 Nonrheumatic mitral (valve) insufficiency: Secondary | ICD-10-CM | POA: Diagnosis not present

## 2016-08-17 DIAGNOSIS — E7800 Pure hypercholesterolemia, unspecified: Secondary | ICD-10-CM | POA: Diagnosis not present

## 2016-08-17 DIAGNOSIS — I10 Essential (primary) hypertension: Secondary | ICD-10-CM | POA: Diagnosis not present

## 2016-08-17 DIAGNOSIS — D693 Immune thrombocytopenic purpura: Secondary | ICD-10-CM | POA: Diagnosis not present

## 2016-08-17 DIAGNOSIS — E119 Type 2 diabetes mellitus without complications: Secondary | ICD-10-CM | POA: Diagnosis not present

## 2016-08-29 DIAGNOSIS — I340 Nonrheumatic mitral (valve) insufficiency: Secondary | ICD-10-CM | POA: Diagnosis not present

## 2016-09-04 DIAGNOSIS — J069 Acute upper respiratory infection, unspecified: Secondary | ICD-10-CM | POA: Diagnosis not present

## 2016-10-10 DIAGNOSIS — C44519 Basal cell carcinoma of skin of other part of trunk: Secondary | ICD-10-CM | POA: Diagnosis not present

## 2016-10-10 DIAGNOSIS — Z08 Encounter for follow-up examination after completed treatment for malignant neoplasm: Secondary | ICD-10-CM | POA: Diagnosis not present

## 2016-10-10 DIAGNOSIS — C44311 Basal cell carcinoma of skin of nose: Secondary | ICD-10-CM | POA: Diagnosis not present

## 2016-10-10 DIAGNOSIS — C4441 Basal cell carcinoma of skin of scalp and neck: Secondary | ICD-10-CM | POA: Diagnosis not present

## 2016-10-10 DIAGNOSIS — X32XXXA Exposure to sunlight, initial encounter: Secondary | ICD-10-CM | POA: Diagnosis not present

## 2016-10-10 DIAGNOSIS — Z85828 Personal history of other malignant neoplasm of skin: Secondary | ICD-10-CM | POA: Diagnosis not present

## 2016-10-10 DIAGNOSIS — C44619 Basal cell carcinoma of skin of left upper limb, including shoulder: Secondary | ICD-10-CM | POA: Diagnosis not present

## 2016-10-10 DIAGNOSIS — D485 Neoplasm of uncertain behavior of skin: Secondary | ICD-10-CM | POA: Diagnosis not present

## 2016-10-10 DIAGNOSIS — L570 Actinic keratosis: Secondary | ICD-10-CM | POA: Diagnosis not present

## 2016-11-15 DIAGNOSIS — L905 Scar conditions and fibrosis of skin: Secondary | ICD-10-CM | POA: Diagnosis not present

## 2016-11-15 DIAGNOSIS — C4441 Basal cell carcinoma of skin of scalp and neck: Secondary | ICD-10-CM | POA: Diagnosis not present

## 2016-11-29 DIAGNOSIS — C44519 Basal cell carcinoma of skin of other part of trunk: Secondary | ICD-10-CM | POA: Diagnosis not present

## 2016-11-29 DIAGNOSIS — C44619 Basal cell carcinoma of skin of left upper limb, including shoulder: Secondary | ICD-10-CM | POA: Diagnosis not present

## 2016-12-01 DIAGNOSIS — L814 Other melanin hyperpigmentation: Secondary | ICD-10-CM | POA: Diagnosis not present

## 2016-12-01 DIAGNOSIS — L578 Other skin changes due to chronic exposure to nonionizing radiation: Secondary | ICD-10-CM | POA: Diagnosis not present

## 2016-12-01 DIAGNOSIS — D485 Neoplasm of uncertain behavior of skin: Secondary | ICD-10-CM | POA: Diagnosis not present

## 2016-12-01 DIAGNOSIS — C44311 Basal cell carcinoma of skin of nose: Secondary | ICD-10-CM | POA: Diagnosis not present

## 2016-12-01 DIAGNOSIS — L908 Other atrophic disorders of skin: Secondary | ICD-10-CM | POA: Diagnosis not present

## 2017-01-04 DIAGNOSIS — D485 Neoplasm of uncertain behavior of skin: Secondary | ICD-10-CM | POA: Diagnosis not present

## 2017-01-04 DIAGNOSIS — C44212 Basal cell carcinoma of skin of right ear and external auricular canal: Secondary | ICD-10-CM | POA: Diagnosis not present

## 2017-01-04 DIAGNOSIS — Z85828 Personal history of other malignant neoplasm of skin: Secondary | ICD-10-CM | POA: Diagnosis not present

## 2017-01-16 DIAGNOSIS — C44212 Basal cell carcinoma of skin of right ear and external auricular canal: Secondary | ICD-10-CM | POA: Diagnosis not present

## 2017-01-16 DIAGNOSIS — C4491 Basal cell carcinoma of skin, unspecified: Secondary | ICD-10-CM | POA: Diagnosis not present

## 2017-01-16 DIAGNOSIS — C44119 Basal cell carcinoma of skin of left eyelid, including canthus: Secondary | ICD-10-CM | POA: Diagnosis not present

## 2017-01-16 DIAGNOSIS — D492 Neoplasm of unspecified behavior of bone, soft tissue, and skin: Secondary | ICD-10-CM | POA: Diagnosis not present

## 2017-01-16 DIAGNOSIS — L578 Other skin changes due to chronic exposure to nonionizing radiation: Secondary | ICD-10-CM | POA: Diagnosis not present

## 2017-02-14 DIAGNOSIS — D693 Immune thrombocytopenic purpura: Secondary | ICD-10-CM | POA: Diagnosis not present

## 2017-02-14 DIAGNOSIS — E119 Type 2 diabetes mellitus without complications: Secondary | ICD-10-CM | POA: Diagnosis not present

## 2017-02-14 DIAGNOSIS — E7800 Pure hypercholesterolemia, unspecified: Secondary | ICD-10-CM | POA: Diagnosis not present

## 2017-02-14 DIAGNOSIS — I10 Essential (primary) hypertension: Secondary | ICD-10-CM | POA: Diagnosis not present

## 2017-02-21 DIAGNOSIS — E119 Type 2 diabetes mellitus without complications: Secondary | ICD-10-CM | POA: Diagnosis not present

## 2017-02-21 DIAGNOSIS — Z0000 Encounter for general adult medical examination without abnormal findings: Secondary | ICD-10-CM | POA: Diagnosis not present

## 2017-02-21 DIAGNOSIS — E7800 Pure hypercholesterolemia, unspecified: Secondary | ICD-10-CM | POA: Diagnosis not present

## 2017-02-21 DIAGNOSIS — D693 Immune thrombocytopenic purpura: Secondary | ICD-10-CM | POA: Diagnosis not present

## 2017-02-21 DIAGNOSIS — I10 Essential (primary) hypertension: Secondary | ICD-10-CM | POA: Diagnosis not present

## 2017-02-21 DIAGNOSIS — N183 Chronic kidney disease, stage 3 (moderate): Secondary | ICD-10-CM | POA: Diagnosis not present

## 2017-03-07 DIAGNOSIS — C44619 Basal cell carcinoma of skin of left upper limb, including shoulder: Secondary | ICD-10-CM | POA: Diagnosis not present

## 2017-03-07 DIAGNOSIS — D485 Neoplasm of uncertain behavior of skin: Secondary | ICD-10-CM | POA: Diagnosis not present

## 2017-03-07 DIAGNOSIS — Z08 Encounter for follow-up examination after completed treatment for malignant neoplasm: Secondary | ICD-10-CM | POA: Diagnosis not present

## 2017-03-07 DIAGNOSIS — L570 Actinic keratosis: Secondary | ICD-10-CM | POA: Diagnosis not present

## 2017-03-07 DIAGNOSIS — C4441 Basal cell carcinoma of skin of scalp and neck: Secondary | ICD-10-CM | POA: Diagnosis not present

## 2017-03-07 DIAGNOSIS — Z85828 Personal history of other malignant neoplasm of skin: Secondary | ICD-10-CM | POA: Diagnosis not present

## 2017-03-07 DIAGNOSIS — X32XXXA Exposure to sunlight, initial encounter: Secondary | ICD-10-CM | POA: Diagnosis not present

## 2017-03-07 DIAGNOSIS — C44319 Basal cell carcinoma of skin of other parts of face: Secondary | ICD-10-CM | POA: Diagnosis not present

## 2017-04-16 DIAGNOSIS — C44319 Basal cell carcinoma of skin of other parts of face: Secondary | ICD-10-CM | POA: Diagnosis not present

## 2017-04-20 ENCOUNTER — Other Ambulatory Visit: Payer: Self-pay | Admitting: Family Medicine

## 2017-04-20 DIAGNOSIS — R3989 Other symptoms and signs involving the genitourinary system: Secondary | ICD-10-CM | POA: Diagnosis not present

## 2017-04-20 DIAGNOSIS — R1030 Lower abdominal pain, unspecified: Secondary | ICD-10-CM | POA: Diagnosis not present

## 2017-04-23 DIAGNOSIS — L905 Scar conditions and fibrosis of skin: Secondary | ICD-10-CM | POA: Diagnosis not present

## 2017-04-23 DIAGNOSIS — T814XXA Infection following a procedure, initial encounter: Secondary | ICD-10-CM | POA: Diagnosis not present

## 2017-04-23 DIAGNOSIS — C44619 Basal cell carcinoma of skin of left upper limb, including shoulder: Secondary | ICD-10-CM | POA: Diagnosis not present

## 2017-05-01 ENCOUNTER — Ambulatory Visit
Admission: RE | Admit: 2017-05-01 | Discharge: 2017-05-01 | Disposition: A | Discharge status: Home / Self Care | Payer: PPO | Source: Ambulatory Visit | Attending: Family Medicine | Admitting: Family Medicine

## 2017-05-01 DIAGNOSIS — K5732 Diverticulitis of large intestine without perforation or abscess without bleeding: Secondary | ICD-10-CM | POA: Insufficient documentation

## 2017-05-01 DIAGNOSIS — M5137 Other intervertebral disc degeneration, lumbosacral region: Secondary | ICD-10-CM | POA: Diagnosis not present

## 2017-05-01 DIAGNOSIS — M47814 Spondylosis without myelopathy or radiculopathy, thoracic region: Secondary | ICD-10-CM | POA: Insufficient documentation

## 2017-05-01 DIAGNOSIS — M48061 Spinal stenosis, lumbar region without neurogenic claudication: Secondary | ICD-10-CM | POA: Diagnosis not present

## 2017-05-01 DIAGNOSIS — R109 Unspecified abdominal pain: Secondary | ICD-10-CM | POA: Diagnosis not present

## 2017-05-01 DIAGNOSIS — N281 Cyst of kidney, acquired: Secondary | ICD-10-CM | POA: Insufficient documentation

## 2017-05-01 DIAGNOSIS — N400 Enlarged prostate without lower urinary tract symptoms: Secondary | ICD-10-CM | POA: Insufficient documentation

## 2017-05-01 DIAGNOSIS — I700 Atherosclerosis of aorta: Secondary | ICD-10-CM | POA: Diagnosis not present

## 2017-05-01 DIAGNOSIS — J9811 Atelectasis: Secondary | ICD-10-CM | POA: Diagnosis not present

## 2017-05-01 DIAGNOSIS — R1030 Lower abdominal pain, unspecified: Secondary | ICD-10-CM | POA: Insufficient documentation

## 2017-05-01 DIAGNOSIS — K4020 Bilateral inguinal hernia, without obstruction or gangrene, not specified as recurrent: Secondary | ICD-10-CM | POA: Insufficient documentation

## 2017-05-01 HISTORY — DX: Essential (primary) hypertension: I10

## 2017-05-01 HISTORY — DX: Malignant (primary) neoplasm, unspecified: C80.1

## 2017-05-01 HISTORY — DX: Type 2 diabetes mellitus without complications: E11.9

## 2017-05-01 MED ORDER — IOPAMIDOL (ISOVUE-300) INJECTION 61%
100.0000 mL | Freq: Once | INTRAVENOUS | Status: AC | PRN
Start: 2017-05-01 — End: 2017-05-01
  Administered 2017-05-01: 100 mL via INTRAVENOUS

## 2017-06-06 DIAGNOSIS — E7800 Pure hypercholesterolemia, unspecified: Secondary | ICD-10-CM | POA: Diagnosis not present

## 2017-06-06 DIAGNOSIS — N183 Chronic kidney disease, stage 3 (moderate): Secondary | ICD-10-CM | POA: Diagnosis not present

## 2017-06-06 DIAGNOSIS — E119 Type 2 diabetes mellitus without complications: Secondary | ICD-10-CM | POA: Diagnosis not present

## 2017-06-06 DIAGNOSIS — I10 Essential (primary) hypertension: Secondary | ICD-10-CM | POA: Diagnosis not present

## 2017-06-06 DIAGNOSIS — D693 Immune thrombocytopenic purpura: Secondary | ICD-10-CM | POA: Diagnosis not present

## 2017-06-13 DIAGNOSIS — D693 Immune thrombocytopenic purpura: Secondary | ICD-10-CM | POA: Diagnosis not present

## 2017-06-13 DIAGNOSIS — I10 Essential (primary) hypertension: Secondary | ICD-10-CM | POA: Diagnosis not present

## 2017-06-13 DIAGNOSIS — E119 Type 2 diabetes mellitus without complications: Secondary | ICD-10-CM | POA: Diagnosis not present

## 2017-06-13 DIAGNOSIS — E7800 Pure hypercholesterolemia, unspecified: Secondary | ICD-10-CM | POA: Diagnosis not present

## 2017-06-26 DIAGNOSIS — Z23 Encounter for immunization: Secondary | ICD-10-CM | POA: Diagnosis not present

## 2017-07-05 DIAGNOSIS — C44319 Basal cell carcinoma of skin of other parts of face: Secondary | ICD-10-CM | POA: Diagnosis not present

## 2017-07-05 DIAGNOSIS — Z85828 Personal history of other malignant neoplasm of skin: Secondary | ICD-10-CM | POA: Diagnosis not present

## 2017-07-30 DIAGNOSIS — D492 Neoplasm of unspecified behavior of bone, soft tissue, and skin: Secondary | ICD-10-CM | POA: Diagnosis not present

## 2017-07-30 DIAGNOSIS — C44329 Squamous cell carcinoma of skin of other parts of face: Secondary | ICD-10-CM | POA: Diagnosis not present

## 2017-07-30 DIAGNOSIS — L988 Other specified disorders of the skin and subcutaneous tissue: Secondary | ICD-10-CM | POA: Diagnosis not present

## 2017-07-30 DIAGNOSIS — L814 Other melanin hyperpigmentation: Secondary | ICD-10-CM | POA: Diagnosis not present

## 2017-07-30 DIAGNOSIS — L578 Other skin changes due to chronic exposure to nonionizing radiation: Secondary | ICD-10-CM | POA: Diagnosis not present

## 2017-10-09 DIAGNOSIS — C44622 Squamous cell carcinoma of skin of right upper limb, including shoulder: Secondary | ICD-10-CM | POA: Diagnosis not present

## 2017-10-09 DIAGNOSIS — Z85828 Personal history of other malignant neoplasm of skin: Secondary | ICD-10-CM | POA: Diagnosis not present

## 2017-10-09 DIAGNOSIS — C4441 Basal cell carcinoma of skin of scalp and neck: Secondary | ICD-10-CM | POA: Diagnosis not present

## 2017-10-09 DIAGNOSIS — Z08 Encounter for follow-up examination after completed treatment for malignant neoplasm: Secondary | ICD-10-CM | POA: Diagnosis not present

## 2017-10-09 DIAGNOSIS — D0472 Carcinoma in situ of skin of left lower limb, including hip: Secondary | ICD-10-CM | POA: Diagnosis not present

## 2017-10-09 DIAGNOSIS — C44311 Basal cell carcinoma of skin of nose: Secondary | ICD-10-CM | POA: Diagnosis not present

## 2017-10-09 DIAGNOSIS — L570 Actinic keratosis: Secondary | ICD-10-CM | POA: Diagnosis not present

## 2017-10-09 DIAGNOSIS — C4401 Basal cell carcinoma of skin of lip: Secondary | ICD-10-CM | POA: Diagnosis not present

## 2017-10-09 DIAGNOSIS — X32XXXA Exposure to sunlight, initial encounter: Secondary | ICD-10-CM | POA: Diagnosis not present

## 2017-10-09 DIAGNOSIS — D485 Neoplasm of uncertain behavior of skin: Secondary | ICD-10-CM | POA: Diagnosis not present

## 2017-11-01 DIAGNOSIS — C44622 Squamous cell carcinoma of skin of right upper limb, including shoulder: Secondary | ICD-10-CM | POA: Diagnosis not present

## 2017-11-01 DIAGNOSIS — L905 Scar conditions and fibrosis of skin: Secondary | ICD-10-CM | POA: Diagnosis not present

## 2017-11-14 DIAGNOSIS — D0472 Carcinoma in situ of skin of left lower limb, including hip: Secondary | ICD-10-CM | POA: Diagnosis not present

## 2017-11-14 DIAGNOSIS — L905 Scar conditions and fibrosis of skin: Secondary | ICD-10-CM | POA: Diagnosis not present

## 2017-11-14 DIAGNOSIS — C4441 Basal cell carcinoma of skin of scalp and neck: Secondary | ICD-10-CM | POA: Diagnosis not present

## 2017-12-04 DIAGNOSIS — D693 Immune thrombocytopenic purpura: Secondary | ICD-10-CM | POA: Diagnosis not present

## 2017-12-04 DIAGNOSIS — I10 Essential (primary) hypertension: Secondary | ICD-10-CM | POA: Diagnosis not present

## 2017-12-04 DIAGNOSIS — E7800 Pure hypercholesterolemia, unspecified: Secondary | ICD-10-CM | POA: Diagnosis not present

## 2017-12-04 DIAGNOSIS — E119 Type 2 diabetes mellitus without complications: Secondary | ICD-10-CM | POA: Diagnosis not present

## 2017-12-11 DIAGNOSIS — I10 Essential (primary) hypertension: Secondary | ICD-10-CM | POA: Diagnosis not present

## 2017-12-11 DIAGNOSIS — Z0000 Encounter for general adult medical examination without abnormal findings: Secondary | ICD-10-CM | POA: Diagnosis not present

## 2017-12-11 DIAGNOSIS — D693 Immune thrombocytopenic purpura: Secondary | ICD-10-CM | POA: Diagnosis not present

## 2017-12-11 DIAGNOSIS — E7800 Pure hypercholesterolemia, unspecified: Secondary | ICD-10-CM | POA: Diagnosis not present

## 2017-12-11 DIAGNOSIS — E119 Type 2 diabetes mellitus without complications: Secondary | ICD-10-CM | POA: Diagnosis not present

## 2018-01-25 DIAGNOSIS — D485 Neoplasm of uncertain behavior of skin: Secondary | ICD-10-CM | POA: Diagnosis not present

## 2018-01-25 DIAGNOSIS — D044 Carcinoma in situ of skin of scalp and neck: Secondary | ICD-10-CM | POA: Diagnosis not present

## 2018-01-25 DIAGNOSIS — L570 Actinic keratosis: Secondary | ICD-10-CM | POA: Diagnosis not present

## 2018-01-25 DIAGNOSIS — C44519 Basal cell carcinoma of skin of other part of trunk: Secondary | ICD-10-CM | POA: Diagnosis not present

## 2018-02-20 DIAGNOSIS — M542 Cervicalgia: Secondary | ICD-10-CM | POA: Diagnosis not present

## 2018-02-20 DIAGNOSIS — E119 Type 2 diabetes mellitus without complications: Secondary | ICD-10-CM | POA: Diagnosis not present

## 2018-03-06 DIAGNOSIS — C44519 Basal cell carcinoma of skin of other part of trunk: Secondary | ICD-10-CM | POA: Diagnosis not present

## 2018-03-22 DIAGNOSIS — D044 Carcinoma in situ of skin of scalp and neck: Secondary | ICD-10-CM | POA: Diagnosis not present

## 2018-03-22 DIAGNOSIS — L570 Actinic keratosis: Secondary | ICD-10-CM | POA: Diagnosis not present

## 2018-03-22 DIAGNOSIS — X32XXXA Exposure to sunlight, initial encounter: Secondary | ICD-10-CM | POA: Diagnosis not present

## 2018-05-16 DIAGNOSIS — Z85828 Personal history of other malignant neoplasm of skin: Secondary | ICD-10-CM | POA: Diagnosis not present

## 2018-05-16 DIAGNOSIS — L570 Actinic keratosis: Secondary | ICD-10-CM | POA: Diagnosis not present

## 2018-05-16 DIAGNOSIS — C4401 Basal cell carcinoma of skin of lip: Secondary | ICD-10-CM | POA: Diagnosis not present

## 2018-06-06 DIAGNOSIS — D693 Immune thrombocytopenic purpura: Secondary | ICD-10-CM | POA: Diagnosis not present

## 2018-06-06 DIAGNOSIS — E7800 Pure hypercholesterolemia, unspecified: Secondary | ICD-10-CM | POA: Diagnosis not present

## 2018-06-06 DIAGNOSIS — E119 Type 2 diabetes mellitus without complications: Secondary | ICD-10-CM | POA: Diagnosis not present

## 2018-06-06 DIAGNOSIS — I10 Essential (primary) hypertension: Secondary | ICD-10-CM | POA: Diagnosis not present

## 2018-06-13 DIAGNOSIS — E119 Type 2 diabetes mellitus without complications: Secondary | ICD-10-CM | POA: Diagnosis not present

## 2018-06-13 DIAGNOSIS — E7800 Pure hypercholesterolemia, unspecified: Secondary | ICD-10-CM | POA: Diagnosis not present

## 2018-06-13 DIAGNOSIS — Z0000 Encounter for general adult medical examination without abnormal findings: Secondary | ICD-10-CM | POA: Diagnosis not present

## 2018-06-13 DIAGNOSIS — D693 Immune thrombocytopenic purpura: Secondary | ICD-10-CM | POA: Diagnosis not present

## 2018-07-08 DIAGNOSIS — R000 Tachycardia, unspecified: Secondary | ICD-10-CM | POA: Diagnosis not present

## 2018-07-08 DIAGNOSIS — R002 Palpitations: Secondary | ICD-10-CM | POA: Diagnosis not present

## 2018-07-12 DIAGNOSIS — R000 Tachycardia, unspecified: Secondary | ICD-10-CM | POA: Diagnosis not present

## 2018-07-15 DIAGNOSIS — E7800 Pure hypercholesterolemia, unspecified: Secondary | ICD-10-CM | POA: Diagnosis not present

## 2018-07-15 DIAGNOSIS — I340 Nonrheumatic mitral (valve) insufficiency: Secondary | ICD-10-CM | POA: Diagnosis not present

## 2018-07-15 DIAGNOSIS — I208 Other forms of angina pectoris: Secondary | ICD-10-CM | POA: Diagnosis not present

## 2018-07-15 DIAGNOSIS — I471 Supraventricular tachycardia: Secondary | ICD-10-CM | POA: Diagnosis not present

## 2018-07-15 DIAGNOSIS — E119 Type 2 diabetes mellitus without complications: Secondary | ICD-10-CM | POA: Diagnosis not present

## 2018-07-15 DIAGNOSIS — D693 Immune thrombocytopenic purpura: Secondary | ICD-10-CM | POA: Diagnosis not present

## 2018-07-15 DIAGNOSIS — N400 Enlarged prostate without lower urinary tract symptoms: Secondary | ICD-10-CM | POA: Diagnosis not present

## 2018-07-15 DIAGNOSIS — I10 Essential (primary) hypertension: Secondary | ICD-10-CM | POA: Diagnosis not present

## 2018-07-29 DIAGNOSIS — I208 Other forms of angina pectoris: Secondary | ICD-10-CM | POA: Diagnosis not present

## 2018-07-29 DIAGNOSIS — I471 Supraventricular tachycardia: Secondary | ICD-10-CM | POA: Diagnosis not present

## 2018-08-09 DIAGNOSIS — I208 Other forms of angina pectoris: Secondary | ICD-10-CM | POA: Diagnosis not present

## 2018-08-09 DIAGNOSIS — I471 Supraventricular tachycardia: Secondary | ICD-10-CM | POA: Diagnosis not present

## 2018-08-16 DIAGNOSIS — E119 Type 2 diabetes mellitus without complications: Secondary | ICD-10-CM | POA: Diagnosis not present

## 2018-08-16 DIAGNOSIS — I340 Nonrheumatic mitral (valve) insufficiency: Secondary | ICD-10-CM | POA: Diagnosis not present

## 2018-08-16 DIAGNOSIS — I471 Supraventricular tachycardia: Secondary | ICD-10-CM | POA: Diagnosis not present

## 2018-08-16 DIAGNOSIS — I429 Cardiomyopathy, unspecified: Secondary | ICD-10-CM | POA: Diagnosis not present

## 2018-08-16 DIAGNOSIS — D693 Immune thrombocytopenic purpura: Secondary | ICD-10-CM | POA: Diagnosis not present

## 2018-08-16 DIAGNOSIS — N400 Enlarged prostate without lower urinary tract symptoms: Secondary | ICD-10-CM | POA: Diagnosis not present

## 2018-08-16 DIAGNOSIS — I10 Essential (primary) hypertension: Secondary | ICD-10-CM | POA: Diagnosis not present

## 2018-08-21 DIAGNOSIS — D485 Neoplasm of uncertain behavior of skin: Secondary | ICD-10-CM | POA: Diagnosis not present

## 2018-08-21 DIAGNOSIS — C44519 Basal cell carcinoma of skin of other part of trunk: Secondary | ICD-10-CM | POA: Diagnosis not present

## 2018-08-21 DIAGNOSIS — Z08 Encounter for follow-up examination after completed treatment for malignant neoplasm: Secondary | ICD-10-CM | POA: Diagnosis not present

## 2018-08-21 DIAGNOSIS — L570 Actinic keratosis: Secondary | ICD-10-CM | POA: Diagnosis not present

## 2018-08-21 DIAGNOSIS — C44319 Basal cell carcinoma of skin of other parts of face: Secondary | ICD-10-CM | POA: Diagnosis not present

## 2018-08-21 DIAGNOSIS — Z85828 Personal history of other malignant neoplasm of skin: Secondary | ICD-10-CM | POA: Diagnosis not present

## 2018-08-21 DIAGNOSIS — X32XXXA Exposure to sunlight, initial encounter: Secondary | ICD-10-CM | POA: Diagnosis not present

## 2018-08-21 DIAGNOSIS — C4441 Basal cell carcinoma of skin of scalp and neck: Secondary | ICD-10-CM | POA: Diagnosis not present

## 2018-10-23 DIAGNOSIS — C44319 Basal cell carcinoma of skin of other parts of face: Secondary | ICD-10-CM | POA: Diagnosis not present

## 2018-10-23 DIAGNOSIS — C4441 Basal cell carcinoma of skin of scalp and neck: Secondary | ICD-10-CM | POA: Diagnosis not present

## 2018-10-23 DIAGNOSIS — C44519 Basal cell carcinoma of skin of other part of trunk: Secondary | ICD-10-CM | POA: Diagnosis not present

## 2018-12-10 DIAGNOSIS — D693 Immune thrombocytopenic purpura: Secondary | ICD-10-CM | POA: Diagnosis not present

## 2018-12-10 DIAGNOSIS — E7800 Pure hypercholesterolemia, unspecified: Secondary | ICD-10-CM | POA: Diagnosis not present

## 2018-12-10 DIAGNOSIS — E119 Type 2 diabetes mellitus without complications: Secondary | ICD-10-CM | POA: Diagnosis not present

## 2018-12-17 DIAGNOSIS — E1169 Type 2 diabetes mellitus with other specified complication: Secondary | ICD-10-CM | POA: Diagnosis not present

## 2018-12-17 DIAGNOSIS — Z0000 Encounter for general adult medical examination without abnormal findings: Secondary | ICD-10-CM | POA: Diagnosis not present

## 2018-12-17 DIAGNOSIS — R001 Bradycardia, unspecified: Secondary | ICD-10-CM | POA: Diagnosis not present

## 2018-12-17 DIAGNOSIS — E785 Hyperlipidemia, unspecified: Secondary | ICD-10-CM | POA: Diagnosis not present

## 2018-12-17 DIAGNOSIS — D693 Immune thrombocytopenic purpura: Secondary | ICD-10-CM | POA: Diagnosis not present

## 2018-12-17 DIAGNOSIS — I10 Essential (primary) hypertension: Secondary | ICD-10-CM | POA: Diagnosis not present

## 2018-12-17 DIAGNOSIS — E7800 Pure hypercholesterolemia, unspecified: Secondary | ICD-10-CM | POA: Diagnosis not present

## 2019-02-06 DIAGNOSIS — I208 Other forms of angina pectoris: Secondary | ICD-10-CM | POA: Diagnosis not present

## 2019-02-06 DIAGNOSIS — I10 Essential (primary) hypertension: Secondary | ICD-10-CM | POA: Diagnosis not present

## 2019-02-06 DIAGNOSIS — E7800 Pure hypercholesterolemia, unspecified: Secondary | ICD-10-CM | POA: Diagnosis not present

## 2019-02-06 DIAGNOSIS — N400 Enlarged prostate without lower urinary tract symptoms: Secondary | ICD-10-CM | POA: Diagnosis not present

## 2019-02-06 DIAGNOSIS — E119 Type 2 diabetes mellitus without complications: Secondary | ICD-10-CM | POA: Diagnosis not present

## 2019-02-06 DIAGNOSIS — S065X0S Traumatic subdural hemorrhage without loss of consciousness, sequela: Secondary | ICD-10-CM | POA: Diagnosis not present

## 2019-02-06 DIAGNOSIS — I429 Cardiomyopathy, unspecified: Secondary | ICD-10-CM | POA: Diagnosis not present

## 2019-02-06 DIAGNOSIS — D693 Immune thrombocytopenic purpura: Secondary | ICD-10-CM | POA: Diagnosis not present

## 2019-02-06 DIAGNOSIS — I471 Supraventricular tachycardia: Secondary | ICD-10-CM | POA: Diagnosis not present

## 2019-02-06 DIAGNOSIS — E1169 Type 2 diabetes mellitus with other specified complication: Secondary | ICD-10-CM | POA: Diagnosis not present

## 2019-02-06 DIAGNOSIS — I340 Nonrheumatic mitral (valve) insufficiency: Secondary | ICD-10-CM | POA: Diagnosis not present

## 2019-02-06 DIAGNOSIS — E785 Hyperlipidemia, unspecified: Secondary | ICD-10-CM | POA: Diagnosis not present

## 2019-04-16 DIAGNOSIS — E7800 Pure hypercholesterolemia, unspecified: Secondary | ICD-10-CM | POA: Diagnosis not present

## 2019-04-16 DIAGNOSIS — E785 Hyperlipidemia, unspecified: Secondary | ICD-10-CM | POA: Diagnosis not present

## 2019-04-16 DIAGNOSIS — E1169 Type 2 diabetes mellitus with other specified complication: Secondary | ICD-10-CM | POA: Diagnosis not present

## 2019-04-16 DIAGNOSIS — I10 Essential (primary) hypertension: Secondary | ICD-10-CM | POA: Diagnosis not present

## 2019-04-16 DIAGNOSIS — D693 Immune thrombocytopenic purpura: Secondary | ICD-10-CM | POA: Diagnosis not present

## 2019-04-23 DIAGNOSIS — N289 Disorder of kidney and ureter, unspecified: Secondary | ICD-10-CM | POA: Diagnosis not present

## 2019-04-23 DIAGNOSIS — E7800 Pure hypercholesterolemia, unspecified: Secondary | ICD-10-CM | POA: Diagnosis not present

## 2019-04-23 DIAGNOSIS — E785 Hyperlipidemia, unspecified: Secondary | ICD-10-CM | POA: Diagnosis not present

## 2019-04-23 DIAGNOSIS — D693 Immune thrombocytopenic purpura: Secondary | ICD-10-CM | POA: Diagnosis not present

## 2019-04-23 DIAGNOSIS — E1169 Type 2 diabetes mellitus with other specified complication: Secondary | ICD-10-CM | POA: Diagnosis not present

## 2019-04-23 DIAGNOSIS — I10 Essential (primary) hypertension: Secondary | ICD-10-CM | POA: Diagnosis not present

## 2019-05-19 DIAGNOSIS — R1032 Left lower quadrant pain: Secondary | ICD-10-CM | POA: Diagnosis not present

## 2019-05-19 DIAGNOSIS — R1031 Right lower quadrant pain: Secondary | ICD-10-CM | POA: Diagnosis not present

## 2019-08-05 DIAGNOSIS — R1031 Right lower quadrant pain: Secondary | ICD-10-CM | POA: Diagnosis not present

## 2019-08-05 DIAGNOSIS — R1032 Left lower quadrant pain: Secondary | ICD-10-CM | POA: Diagnosis not present

## 2019-08-14 DIAGNOSIS — I471 Supraventricular tachycardia: Secondary | ICD-10-CM | POA: Diagnosis not present

## 2019-08-14 DIAGNOSIS — N400 Enlarged prostate without lower urinary tract symptoms: Secondary | ICD-10-CM | POA: Diagnosis not present

## 2019-08-14 DIAGNOSIS — I10 Essential (primary) hypertension: Secondary | ICD-10-CM | POA: Diagnosis not present

## 2019-08-14 DIAGNOSIS — R002 Palpitations: Secondary | ICD-10-CM | POA: Diagnosis not present

## 2019-08-14 DIAGNOSIS — E1169 Type 2 diabetes mellitus with other specified complication: Secondary | ICD-10-CM | POA: Diagnosis not present

## 2019-08-14 DIAGNOSIS — E119 Type 2 diabetes mellitus without complications: Secondary | ICD-10-CM | POA: Diagnosis not present

## 2019-08-14 DIAGNOSIS — E7800 Pure hypercholesterolemia, unspecified: Secondary | ICD-10-CM | POA: Diagnosis not present

## 2019-08-14 DIAGNOSIS — I208 Other forms of angina pectoris: Secondary | ICD-10-CM | POA: Diagnosis not present

## 2019-08-14 DIAGNOSIS — D693 Immune thrombocytopenic purpura: Secondary | ICD-10-CM | POA: Diagnosis not present

## 2019-08-14 DIAGNOSIS — I340 Nonrheumatic mitral (valve) insufficiency: Secondary | ICD-10-CM | POA: Diagnosis not present

## 2019-08-14 DIAGNOSIS — S065X0S Traumatic subdural hemorrhage without loss of consciousness, sequela: Secondary | ICD-10-CM | POA: Diagnosis not present

## 2019-08-14 DIAGNOSIS — I429 Cardiomyopathy, unspecified: Secondary | ICD-10-CM | POA: Diagnosis not present

## 2019-08-20 DIAGNOSIS — I10 Essential (primary) hypertension: Secondary | ICD-10-CM | POA: Diagnosis not present

## 2019-08-20 DIAGNOSIS — D693 Immune thrombocytopenic purpura: Secondary | ICD-10-CM | POA: Diagnosis not present

## 2019-08-20 DIAGNOSIS — N289 Disorder of kidney and ureter, unspecified: Secondary | ICD-10-CM | POA: Diagnosis not present

## 2019-08-20 DIAGNOSIS — E7800 Pure hypercholesterolemia, unspecified: Secondary | ICD-10-CM | POA: Diagnosis not present

## 2019-08-20 DIAGNOSIS — E1169 Type 2 diabetes mellitus with other specified complication: Secondary | ICD-10-CM | POA: Diagnosis not present

## 2019-08-20 DIAGNOSIS — E785 Hyperlipidemia, unspecified: Secondary | ICD-10-CM | POA: Diagnosis not present

## 2019-08-27 DIAGNOSIS — Z0000 Encounter for general adult medical examination without abnormal findings: Secondary | ICD-10-CM | POA: Diagnosis not present

## 2019-08-27 DIAGNOSIS — I499 Cardiac arrhythmia, unspecified: Secondary | ICD-10-CM | POA: Diagnosis not present

## 2019-08-27 DIAGNOSIS — E785 Hyperlipidemia, unspecified: Secondary | ICD-10-CM | POA: Diagnosis not present

## 2019-08-27 DIAGNOSIS — E7800 Pure hypercholesterolemia, unspecified: Secondary | ICD-10-CM | POA: Diagnosis not present

## 2019-08-27 DIAGNOSIS — I10 Essential (primary) hypertension: Secondary | ICD-10-CM | POA: Diagnosis not present

## 2019-08-27 DIAGNOSIS — N289 Disorder of kidney and ureter, unspecified: Secondary | ICD-10-CM | POA: Diagnosis not present

## 2019-08-27 DIAGNOSIS — I4891 Unspecified atrial fibrillation: Secondary | ICD-10-CM | POA: Diagnosis not present

## 2019-08-27 DIAGNOSIS — E1169 Type 2 diabetes mellitus with other specified complication: Secondary | ICD-10-CM | POA: Diagnosis not present

## 2019-09-05 DIAGNOSIS — N289 Disorder of kidney and ureter, unspecified: Secondary | ICD-10-CM | POA: Diagnosis not present

## 2019-09-05 DIAGNOSIS — I340 Nonrheumatic mitral (valve) insufficiency: Secondary | ICD-10-CM | POA: Diagnosis not present

## 2019-09-05 DIAGNOSIS — I480 Paroxysmal atrial fibrillation: Secondary | ICD-10-CM | POA: Diagnosis not present

## 2019-09-05 DIAGNOSIS — I471 Supraventricular tachycardia: Secondary | ICD-10-CM | POA: Diagnosis not present

## 2019-09-05 DIAGNOSIS — I10 Essential (primary) hypertension: Secondary | ICD-10-CM | POA: Diagnosis not present

## 2019-09-05 DIAGNOSIS — I208 Other forms of angina pectoris: Secondary | ICD-10-CM | POA: Diagnosis not present

## 2019-09-05 DIAGNOSIS — E119 Type 2 diabetes mellitus without complications: Secondary | ICD-10-CM | POA: Diagnosis not present

## 2019-09-05 DIAGNOSIS — I429 Cardiomyopathy, unspecified: Secondary | ICD-10-CM | POA: Diagnosis not present

## 2019-09-09 DIAGNOSIS — X32XXXA Exposure to sunlight, initial encounter: Secondary | ICD-10-CM | POA: Diagnosis not present

## 2019-09-09 DIAGNOSIS — L578 Other skin changes due to chronic exposure to nonionizing radiation: Secondary | ICD-10-CM | POA: Diagnosis not present

## 2019-09-09 DIAGNOSIS — C44629 Squamous cell carcinoma of skin of left upper limb, including shoulder: Secondary | ICD-10-CM | POA: Diagnosis not present

## 2019-09-09 DIAGNOSIS — D485 Neoplasm of uncertain behavior of skin: Secondary | ICD-10-CM | POA: Diagnosis not present

## 2019-09-09 DIAGNOSIS — L570 Actinic keratosis: Secondary | ICD-10-CM | POA: Diagnosis not present

## 2019-09-09 DIAGNOSIS — Z08 Encounter for follow-up examination after completed treatment for malignant neoplasm: Secondary | ICD-10-CM | POA: Diagnosis not present

## 2019-09-09 DIAGNOSIS — Z85828 Personal history of other malignant neoplasm of skin: Secondary | ICD-10-CM | POA: Diagnosis not present

## 2019-09-09 DIAGNOSIS — L821 Other seborrheic keratosis: Secondary | ICD-10-CM | POA: Diagnosis not present

## 2019-10-07 DIAGNOSIS — I429 Cardiomyopathy, unspecified: Secondary | ICD-10-CM | POA: Diagnosis not present

## 2019-10-07 DIAGNOSIS — N289 Disorder of kidney and ureter, unspecified: Secondary | ICD-10-CM | POA: Diagnosis not present

## 2019-10-07 DIAGNOSIS — E119 Type 2 diabetes mellitus without complications: Secondary | ICD-10-CM | POA: Diagnosis not present

## 2019-10-07 DIAGNOSIS — I471 Supraventricular tachycardia: Secondary | ICD-10-CM | POA: Diagnosis not present

## 2019-10-07 DIAGNOSIS — I480 Paroxysmal atrial fibrillation: Secondary | ICD-10-CM | POA: Diagnosis not present

## 2019-10-07 DIAGNOSIS — I208 Other forms of angina pectoris: Secondary | ICD-10-CM | POA: Diagnosis not present

## 2019-10-07 DIAGNOSIS — I340 Nonrheumatic mitral (valve) insufficiency: Secondary | ICD-10-CM | POA: Diagnosis not present

## 2019-10-07 DIAGNOSIS — D693 Immune thrombocytopenic purpura: Secondary | ICD-10-CM | POA: Diagnosis not present

## 2019-10-07 DIAGNOSIS — S065X0S Traumatic subdural hemorrhage without loss of consciousness, sequela: Secondary | ICD-10-CM | POA: Diagnosis not present

## 2019-10-07 DIAGNOSIS — Z7901 Long term (current) use of anticoagulants: Secondary | ICD-10-CM | POA: Diagnosis not present

## 2019-10-07 DIAGNOSIS — N400 Enlarged prostate without lower urinary tract symptoms: Secondary | ICD-10-CM | POA: Diagnosis not present

## 2019-10-07 DIAGNOSIS — I10 Essential (primary) hypertension: Secondary | ICD-10-CM | POA: Diagnosis not present

## 2019-10-08 DIAGNOSIS — C44629 Squamous cell carcinoma of skin of left upper limb, including shoulder: Secondary | ICD-10-CM | POA: Diagnosis not present

## 2019-10-08 DIAGNOSIS — D0462 Carcinoma in situ of skin of left upper limb, including shoulder: Secondary | ICD-10-CM | POA: Diagnosis not present

## 2019-10-10 DIAGNOSIS — Z7901 Long term (current) use of anticoagulants: Secondary | ICD-10-CM | POA: Diagnosis not present

## 2019-10-27 DIAGNOSIS — I429 Cardiomyopathy, unspecified: Secondary | ICD-10-CM | POA: Diagnosis not present

## 2019-10-27 DIAGNOSIS — Z79899 Other long term (current) drug therapy: Secondary | ICD-10-CM | POA: Diagnosis not present

## 2019-10-27 DIAGNOSIS — I10 Essential (primary) hypertension: Secondary | ICD-10-CM | POA: Diagnosis not present

## 2019-10-27 DIAGNOSIS — I208 Other forms of angina pectoris: Secondary | ICD-10-CM | POA: Diagnosis not present

## 2019-10-27 DIAGNOSIS — D693 Immune thrombocytopenic purpura: Secondary | ICD-10-CM | POA: Diagnosis not present

## 2019-10-27 DIAGNOSIS — I471 Supraventricular tachycardia: Secondary | ICD-10-CM | POA: Diagnosis not present

## 2019-10-27 DIAGNOSIS — E119 Type 2 diabetes mellitus without complications: Secondary | ICD-10-CM | POA: Diagnosis not present

## 2019-10-27 DIAGNOSIS — R002 Palpitations: Secondary | ICD-10-CM | POA: Diagnosis not present

## 2019-10-27 DIAGNOSIS — I480 Paroxysmal atrial fibrillation: Secondary | ICD-10-CM | POA: Diagnosis not present

## 2019-10-27 DIAGNOSIS — I340 Nonrheumatic mitral (valve) insufficiency: Secondary | ICD-10-CM | POA: Diagnosis not present

## 2019-12-02 DIAGNOSIS — Z85828 Personal history of other malignant neoplasm of skin: Secondary | ICD-10-CM | POA: Diagnosis not present

## 2019-12-02 DIAGNOSIS — X32XXXA Exposure to sunlight, initial encounter: Secondary | ICD-10-CM | POA: Diagnosis not present

## 2019-12-02 DIAGNOSIS — C44311 Basal cell carcinoma of skin of nose: Secondary | ICD-10-CM | POA: Diagnosis not present

## 2019-12-02 DIAGNOSIS — L570 Actinic keratosis: Secondary | ICD-10-CM | POA: Diagnosis not present

## 2019-12-02 DIAGNOSIS — Z08 Encounter for follow-up examination after completed treatment for malignant neoplasm: Secondary | ICD-10-CM | POA: Diagnosis not present

## 2019-12-02 DIAGNOSIS — C44319 Basal cell carcinoma of skin of other parts of face: Secondary | ICD-10-CM | POA: Diagnosis not present

## 2019-12-09 DIAGNOSIS — C44319 Basal cell carcinoma of skin of other parts of face: Secondary | ICD-10-CM | POA: Diagnosis not present

## 2019-12-09 DIAGNOSIS — D485 Neoplasm of uncertain behavior of skin: Secondary | ICD-10-CM | POA: Diagnosis not present

## 2019-12-09 DIAGNOSIS — L570 Actinic keratosis: Secondary | ICD-10-CM | POA: Diagnosis not present

## 2019-12-09 DIAGNOSIS — Z85828 Personal history of other malignant neoplasm of skin: Secondary | ICD-10-CM | POA: Diagnosis not present

## 2019-12-09 DIAGNOSIS — L578 Other skin changes due to chronic exposure to nonionizing radiation: Secondary | ICD-10-CM | POA: Diagnosis not present

## 2019-12-09 DIAGNOSIS — C44311 Basal cell carcinoma of skin of nose: Secondary | ICD-10-CM | POA: Diagnosis not present

## 2020-02-10 DIAGNOSIS — Z85828 Personal history of other malignant neoplasm of skin: Secondary | ICD-10-CM | POA: Diagnosis not present

## 2020-02-10 DIAGNOSIS — L570 Actinic keratosis: Secondary | ICD-10-CM | POA: Diagnosis not present

## 2020-02-10 DIAGNOSIS — C44319 Basal cell carcinoma of skin of other parts of face: Secondary | ICD-10-CM | POA: Diagnosis not present

## 2020-02-10 DIAGNOSIS — D485 Neoplasm of uncertain behavior of skin: Secondary | ICD-10-CM | POA: Diagnosis not present

## 2020-02-10 DIAGNOSIS — C44329 Squamous cell carcinoma of skin of other parts of face: Secondary | ICD-10-CM | POA: Diagnosis not present

## 2020-03-08 DIAGNOSIS — C44329 Squamous cell carcinoma of skin of other parts of face: Secondary | ICD-10-CM | POA: Diagnosis not present

## 2020-03-08 DIAGNOSIS — Z85828 Personal history of other malignant neoplasm of skin: Secondary | ICD-10-CM | POA: Diagnosis not present

## 2020-03-16 DIAGNOSIS — I10 Essential (primary) hypertension: Secondary | ICD-10-CM | POA: Diagnosis not present

## 2020-03-16 DIAGNOSIS — N289 Disorder of kidney and ureter, unspecified: Secondary | ICD-10-CM | POA: Diagnosis not present

## 2020-03-16 DIAGNOSIS — E785 Hyperlipidemia, unspecified: Secondary | ICD-10-CM | POA: Diagnosis not present

## 2020-03-16 DIAGNOSIS — E1169 Type 2 diabetes mellitus with other specified complication: Secondary | ICD-10-CM | POA: Diagnosis not present

## 2020-03-16 DIAGNOSIS — E7800 Pure hypercholesterolemia, unspecified: Secondary | ICD-10-CM | POA: Diagnosis not present

## 2020-03-16 DIAGNOSIS — Z0000 Encounter for general adult medical examination without abnormal findings: Secondary | ICD-10-CM | POA: Diagnosis not present

## 2020-03-17 DIAGNOSIS — E785 Hyperlipidemia, unspecified: Secondary | ICD-10-CM | POA: Diagnosis not present

## 2020-03-17 DIAGNOSIS — E7800 Pure hypercholesterolemia, unspecified: Secondary | ICD-10-CM | POA: Diagnosis not present

## 2020-03-17 DIAGNOSIS — E1169 Type 2 diabetes mellitus with other specified complication: Secondary | ICD-10-CM | POA: Diagnosis not present

## 2020-03-31 DIAGNOSIS — I340 Nonrheumatic mitral (valve) insufficiency: Secondary | ICD-10-CM | POA: Diagnosis not present

## 2020-03-31 DIAGNOSIS — E1169 Type 2 diabetes mellitus with other specified complication: Secondary | ICD-10-CM | POA: Diagnosis not present

## 2020-03-31 DIAGNOSIS — I480 Paroxysmal atrial fibrillation: Secondary | ICD-10-CM | POA: Diagnosis not present

## 2020-03-31 DIAGNOSIS — N1832 Chronic kidney disease, stage 3b: Secondary | ICD-10-CM | POA: Diagnosis not present

## 2020-03-31 DIAGNOSIS — E7800 Pure hypercholesterolemia, unspecified: Secondary | ICD-10-CM | POA: Diagnosis not present

## 2020-03-31 DIAGNOSIS — I10 Essential (primary) hypertension: Secondary | ICD-10-CM | POA: Diagnosis not present

## 2020-03-31 DIAGNOSIS — R001 Bradycardia, unspecified: Secondary | ICD-10-CM | POA: Diagnosis not present

## 2020-03-31 DIAGNOSIS — E785 Hyperlipidemia, unspecified: Secondary | ICD-10-CM | POA: Diagnosis not present

## 2020-04-30 DIAGNOSIS — D485 Neoplasm of uncertain behavior of skin: Secondary | ICD-10-CM | POA: Diagnosis not present

## 2020-04-30 DIAGNOSIS — Z85828 Personal history of other malignant neoplasm of skin: Secondary | ICD-10-CM | POA: Diagnosis not present

## 2020-04-30 DIAGNOSIS — L570 Actinic keratosis: Secondary | ICD-10-CM | POA: Diagnosis not present

## 2020-04-30 DIAGNOSIS — C44319 Basal cell carcinoma of skin of other parts of face: Secondary | ICD-10-CM | POA: Diagnosis not present

## 2020-08-03 DIAGNOSIS — Z85828 Personal history of other malignant neoplasm of skin: Secondary | ICD-10-CM | POA: Diagnosis not present

## 2020-08-03 DIAGNOSIS — L814 Other melanin hyperpigmentation: Secondary | ICD-10-CM | POA: Diagnosis not present

## 2020-08-03 DIAGNOSIS — L570 Actinic keratosis: Secondary | ICD-10-CM | POA: Diagnosis not present

## 2020-08-03 DIAGNOSIS — L821 Other seborrheic keratosis: Secondary | ICD-10-CM | POA: Diagnosis not present

## 2020-09-09 DIAGNOSIS — I10 Essential (primary) hypertension: Secondary | ICD-10-CM | POA: Diagnosis not present

## 2020-09-09 DIAGNOSIS — E1169 Type 2 diabetes mellitus with other specified complication: Secondary | ICD-10-CM | POA: Diagnosis not present

## 2020-09-09 DIAGNOSIS — E785 Hyperlipidemia, unspecified: Secondary | ICD-10-CM | POA: Diagnosis not present

## 2020-09-09 DIAGNOSIS — N1832 Chronic kidney disease, stage 3b: Secondary | ICD-10-CM | POA: Diagnosis not present

## 2020-09-09 DIAGNOSIS — E7800 Pure hypercholesterolemia, unspecified: Secondary | ICD-10-CM | POA: Diagnosis not present

## 2020-09-16 DIAGNOSIS — I10 Essential (primary) hypertension: Secondary | ICD-10-CM | POA: Diagnosis not present

## 2020-09-16 DIAGNOSIS — Z0000 Encounter for general adult medical examination without abnormal findings: Secondary | ICD-10-CM | POA: Diagnosis not present

## 2020-09-16 DIAGNOSIS — E1169 Type 2 diabetes mellitus with other specified complication: Secondary | ICD-10-CM | POA: Diagnosis not present

## 2020-09-16 DIAGNOSIS — N1831 Chronic kidney disease, stage 3a: Secondary | ICD-10-CM | POA: Diagnosis not present

## 2020-09-16 DIAGNOSIS — E785 Hyperlipidemia, unspecified: Secondary | ICD-10-CM | POA: Diagnosis not present

## 2020-09-16 DIAGNOSIS — E7800 Pure hypercholesterolemia, unspecified: Secondary | ICD-10-CM | POA: Diagnosis not present

## 2020-09-17 DIAGNOSIS — H9193 Unspecified hearing loss, bilateral: Secondary | ICD-10-CM | POA: Diagnosis not present

## 2020-09-17 DIAGNOSIS — H6123 Impacted cerumen, bilateral: Secondary | ICD-10-CM | POA: Diagnosis not present

## 2020-09-23 DIAGNOSIS — N1831 Chronic kidney disease, stage 3a: Secondary | ICD-10-CM | POA: Diagnosis not present

## 2020-09-30 DIAGNOSIS — N1832 Chronic kidney disease, stage 3b: Secondary | ICD-10-CM | POA: Diagnosis not present

## 2020-09-30 DIAGNOSIS — E7800 Pure hypercholesterolemia, unspecified: Secondary | ICD-10-CM | POA: Diagnosis not present

## 2020-09-30 DIAGNOSIS — I471 Supraventricular tachycardia: Secondary | ICD-10-CM | POA: Diagnosis not present

## 2020-09-30 DIAGNOSIS — I10 Essential (primary) hypertension: Secondary | ICD-10-CM | POA: Diagnosis not present

## 2020-09-30 DIAGNOSIS — R001 Bradycardia, unspecified: Secondary | ICD-10-CM | POA: Diagnosis not present

## 2020-09-30 DIAGNOSIS — I208 Other forms of angina pectoris: Secondary | ICD-10-CM | POA: Diagnosis not present

## 2020-09-30 DIAGNOSIS — I480 Paroxysmal atrial fibrillation: Secondary | ICD-10-CM | POA: Diagnosis not present

## 2020-09-30 DIAGNOSIS — I340 Nonrheumatic mitral (valve) insufficiency: Secondary | ICD-10-CM | POA: Diagnosis not present

## 2020-09-30 DIAGNOSIS — E785 Hyperlipidemia, unspecified: Secondary | ICD-10-CM | POA: Diagnosis not present

## 2020-09-30 DIAGNOSIS — E1169 Type 2 diabetes mellitus with other specified complication: Secondary | ICD-10-CM | POA: Diagnosis not present

## 2020-09-30 DIAGNOSIS — R002 Palpitations: Secondary | ICD-10-CM | POA: Diagnosis not present

## 2020-10-06 DIAGNOSIS — Z85828 Personal history of other malignant neoplasm of skin: Secondary | ICD-10-CM | POA: Diagnosis not present

## 2020-10-06 DIAGNOSIS — D485 Neoplasm of uncertain behavior of skin: Secondary | ICD-10-CM | POA: Diagnosis not present

## 2020-10-06 DIAGNOSIS — C44319 Basal cell carcinoma of skin of other parts of face: Secondary | ICD-10-CM | POA: Diagnosis not present

## 2020-10-06 DIAGNOSIS — L570 Actinic keratosis: Secondary | ICD-10-CM | POA: Diagnosis not present

## 2020-10-06 DIAGNOSIS — D481 Neoplasm of uncertain behavior of connective and other soft tissue: Secondary | ICD-10-CM | POA: Diagnosis not present

## 2020-11-01 DIAGNOSIS — C4442 Squamous cell carcinoma of skin of scalp and neck: Secondary | ICD-10-CM | POA: Diagnosis not present

## 2020-11-01 DIAGNOSIS — L988 Other specified disorders of the skin and subcutaneous tissue: Secondary | ICD-10-CM | POA: Diagnosis not present

## 2020-11-01 DIAGNOSIS — Z85828 Personal history of other malignant neoplasm of skin: Secondary | ICD-10-CM | POA: Diagnosis not present

## 2020-11-08 DIAGNOSIS — L570 Actinic keratosis: Secondary | ICD-10-CM | POA: Diagnosis not present

## 2021-01-04 DIAGNOSIS — Z85828 Personal history of other malignant neoplasm of skin: Secondary | ICD-10-CM | POA: Diagnosis not present

## 2021-01-04 DIAGNOSIS — L578 Other skin changes due to chronic exposure to nonionizing radiation: Secondary | ICD-10-CM | POA: Diagnosis not present

## 2021-01-04 DIAGNOSIS — L570 Actinic keratosis: Secondary | ICD-10-CM | POA: Diagnosis not present

## 2021-01-10 DIAGNOSIS — Z03818 Encounter for observation for suspected exposure to other biological agents ruled out: Secondary | ICD-10-CM | POA: Diagnosis not present

## 2021-01-10 DIAGNOSIS — Z20822 Contact with and (suspected) exposure to covid-19: Secondary | ICD-10-CM | POA: Diagnosis not present

## 2021-03-02 DIAGNOSIS — H6123 Impacted cerumen, bilateral: Secondary | ICD-10-CM | POA: Diagnosis not present

## 2021-03-22 DIAGNOSIS — E7800 Pure hypercholesterolemia, unspecified: Secondary | ICD-10-CM | POA: Diagnosis not present

## 2021-03-22 DIAGNOSIS — E785 Hyperlipidemia, unspecified: Secondary | ICD-10-CM | POA: Diagnosis not present

## 2021-03-22 DIAGNOSIS — E1169 Type 2 diabetes mellitus with other specified complication: Secondary | ICD-10-CM | POA: Diagnosis not present

## 2021-03-22 DIAGNOSIS — I10 Essential (primary) hypertension: Secondary | ICD-10-CM | POA: Diagnosis not present

## 2021-03-28 DIAGNOSIS — Z9229 Personal history of other drug therapy: Secondary | ICD-10-CM | POA: Diagnosis not present

## 2021-03-28 DIAGNOSIS — S61412A Laceration without foreign body of left hand, initial encounter: Secondary | ICD-10-CM | POA: Diagnosis not present

## 2021-03-29 DIAGNOSIS — I10 Essential (primary) hypertension: Secondary | ICD-10-CM | POA: Diagnosis not present

## 2021-03-29 DIAGNOSIS — N1832 Chronic kidney disease, stage 3b: Secondary | ICD-10-CM | POA: Diagnosis not present

## 2021-03-29 DIAGNOSIS — E785 Hyperlipidemia, unspecified: Secondary | ICD-10-CM | POA: Diagnosis not present

## 2021-03-29 DIAGNOSIS — E1169 Type 2 diabetes mellitus with other specified complication: Secondary | ICD-10-CM | POA: Diagnosis not present

## 2021-03-29 DIAGNOSIS — E7800 Pure hypercholesterolemia, unspecified: Secondary | ICD-10-CM | POA: Diagnosis not present

## 2021-03-29 DIAGNOSIS — Z0000 Encounter for general adult medical examination without abnormal findings: Secondary | ICD-10-CM | POA: Diagnosis not present

## 2021-04-07 DIAGNOSIS — Z85828 Personal history of other malignant neoplasm of skin: Secondary | ICD-10-CM | POA: Diagnosis not present

## 2021-04-07 DIAGNOSIS — L821 Other seborrheic keratosis: Secondary | ICD-10-CM | POA: Diagnosis not present

## 2021-04-07 DIAGNOSIS — C44311 Basal cell carcinoma of skin of nose: Secondary | ICD-10-CM | POA: Diagnosis not present

## 2021-04-07 DIAGNOSIS — L570 Actinic keratosis: Secondary | ICD-10-CM | POA: Diagnosis not present

## 2021-05-02 DIAGNOSIS — R3916 Straining to void: Secondary | ICD-10-CM | POA: Diagnosis not present

## 2021-05-02 DIAGNOSIS — R339 Retention of urine, unspecified: Secondary | ICD-10-CM | POA: Diagnosis not present

## 2021-05-02 DIAGNOSIS — N401 Enlarged prostate with lower urinary tract symptoms: Secondary | ICD-10-CM | POA: Diagnosis not present

## 2021-05-02 DIAGNOSIS — I10 Essential (primary) hypertension: Secondary | ICD-10-CM | POA: Diagnosis not present

## 2021-06-03 DIAGNOSIS — I480 Paroxysmal atrial fibrillation: Secondary | ICD-10-CM | POA: Diagnosis not present

## 2021-06-03 DIAGNOSIS — E119 Type 2 diabetes mellitus without complications: Secondary | ICD-10-CM | POA: Diagnosis not present

## 2021-06-03 DIAGNOSIS — R001 Bradycardia, unspecified: Secondary | ICD-10-CM | POA: Diagnosis not present

## 2021-06-03 DIAGNOSIS — N1832 Chronic kidney disease, stage 3b: Secondary | ICD-10-CM | POA: Diagnosis not present

## 2021-06-03 DIAGNOSIS — I340 Nonrheumatic mitral (valve) insufficiency: Secondary | ICD-10-CM | POA: Diagnosis not present

## 2021-06-03 DIAGNOSIS — I10 Essential (primary) hypertension: Secondary | ICD-10-CM | POA: Diagnosis not present

## 2021-06-03 DIAGNOSIS — E7800 Pure hypercholesterolemia, unspecified: Secondary | ICD-10-CM | POA: Diagnosis not present

## 2021-06-21 ENCOUNTER — Other Ambulatory Visit: Payer: Self-pay | Admitting: Family Medicine

## 2021-06-21 DIAGNOSIS — R1032 Left lower quadrant pain: Secondary | ICD-10-CM

## 2021-07-05 DIAGNOSIS — L989 Disorder of the skin and subcutaneous tissue, unspecified: Secondary | ICD-10-CM | POA: Diagnosis not present

## 2021-07-14 ENCOUNTER — Other Ambulatory Visit: Payer: Self-pay

## 2021-07-14 ENCOUNTER — Ambulatory Visit
Admission: RE | Admit: 2021-07-14 | Discharge: 2021-07-14 | Disposition: A | Discharge status: Home / Self Care | Payer: PPO | Source: Ambulatory Visit | Attending: Family Medicine | Admitting: Family Medicine

## 2021-07-14 DIAGNOSIS — K5730 Diverticulosis of large intestine without perforation or abscess without bleeding: Secondary | ICD-10-CM | POA: Diagnosis not present

## 2021-07-14 DIAGNOSIS — N281 Cyst of kidney, acquired: Secondary | ICD-10-CM | POA: Diagnosis not present

## 2021-07-14 DIAGNOSIS — R1032 Left lower quadrant pain: Secondary | ICD-10-CM | POA: Diagnosis not present

## 2021-07-14 DIAGNOSIS — I700 Atherosclerosis of aorta: Secondary | ICD-10-CM | POA: Diagnosis not present

## 2021-07-14 DIAGNOSIS — K4090 Unilateral inguinal hernia, without obstruction or gangrene, not specified as recurrent: Secondary | ICD-10-CM | POA: Diagnosis not present

## 2021-07-14 MED ORDER — IOHEXOL 350 MG/ML SOLN
60.0000 mL | Freq: Once | INTRAVENOUS | Status: AC | PRN
  Administered 2021-07-14: 60 mL via INTRAVENOUS

## 2021-07-18 DIAGNOSIS — L570 Actinic keratosis: Secondary | ICD-10-CM | POA: Diagnosis not present

## 2021-07-18 DIAGNOSIS — L430 Hypertrophic lichen planus: Secondary | ICD-10-CM | POA: Diagnosis not present

## 2021-07-18 DIAGNOSIS — C44319 Basal cell carcinoma of skin of other parts of face: Secondary | ICD-10-CM | POA: Diagnosis not present

## 2021-07-18 DIAGNOSIS — Z85828 Personal history of other malignant neoplasm of skin: Secondary | ICD-10-CM | POA: Diagnosis not present

## 2021-07-18 DIAGNOSIS — C44529 Squamous cell carcinoma of skin of other part of trunk: Secondary | ICD-10-CM | POA: Diagnosis not present

## 2021-07-18 DIAGNOSIS — D485 Neoplasm of uncertain behavior of skin: Secondary | ICD-10-CM | POA: Diagnosis not present

## 2021-07-21 ENCOUNTER — Ambulatory Visit: Payer: Self-pay | Admitting: General Surgery

## 2021-07-21 DIAGNOSIS — K4090 Unilateral inguinal hernia, without obstruction or gangrene, not specified as recurrent: Secondary | ICD-10-CM | POA: Diagnosis not present

## 2021-07-21 NOTE — H&P (View-Only) (Signed)
PATIENT PROFILE: Craig [name] is a 87 y.o. male who presents to the Clinic for consultation at the request of Craig [name], [place] for evaluation of bilateral inguinal hernia.  PCP:  Craig Body, MD  HISTORY OF PRESENT ILLNESS: Craig [name] reports she is having pain in the left groin.  Pain aggravated by bending and doing heavy lifting.  Pain does not radiate to the part of the [name].  There has been elevating factors.  Patient endorsed that he had a right inguinal hernia that had surgery many years ago.  He does not have any pain or any issues on the right side.  He denies any episode of abdominal distention nausea or vomiting.  He denies any other abdominal surgeries.  Patient denies any chest pain.  Patient states that he goes to the gym daily.   PROBLEM LIST: Problem List  Date Reviewed: 07/05/2021          Noted   CKD (chronic kidney disease) stage 3, Cr 1.7, GFR 38 - 03/22/21 04/23/2019   Medicare annual wellness visit, initial: 01/02/13 02/21/2017   Medicare annual wellness visit, subsequent 03/29/21 02/21/2017   Mitral regurgitation - severe (ECHO 08/21/14) Unknown   Essential hypertension Unknown   Pure hypercholesterolemia (LDL 81 - 03/22/21) Unknown   Type 2 diabetes mellitus with hyperlipidemia (A1c 5.9% - 03/22/21) Unknown   Overview    adult onset - dx 2008      Benign prostatic hyperplasia (BPH) with straining on urination Unknown   History of diverticulosis Unknown       GENERAL REVIEW OF SYSTEMS:   General ROS: negative for - chills, fatigue, fever, weight gain or weight loss Allergy and Immunology ROS: negative for - hives  Hematological and Lymphatic ROS: negative for - bleeding problems or bruising, negative for palpable nodes Endocrine ROS: negative for - heat or cold intolerance, hair changes Respiratory ROS: negative for - cough, shortness of breath or wheezing Cardiovascular ROS: no chest pain or palpitations GI ROS: negative for nausea, vomiting, abdominal pain,  diarrhea, positive for constipation Musculoskeletal ROS: Positive for - joint swelling or muscle pain Neurological ROS: negative for - confusion, syncope Dermatological ROS: negative for pruritus and rash Psychiatric: negative for anxiety, depression, difficulty sleeping and memory loss  MEDICATIONS: Current Outpatient Medications  Medication Sig Dispense Refill   apixaban (ELIQUIS) 2.5 mg tablet Take 1 tablet by mouth 2 (two) times daily     blood glucose diagnostic test strip Use 1 strip once a week Use as instructed.       carboxymethylcellulose (REFRESH PLUS) 0.5 % ophthalmic solution INSTILL ONE DROP IN [facility] EYE FOUR TIMES A DAY     cetirizine (ZYRTEC) 10 MG tablet Take 5 mg by mouth nightly as needed for Allergies.     fluorouraciL (EFUDEX) 5 % cream APPLY TO AFFECTED AREAS ON SKIN TWICE A DAY FOR 7 DAYS     fluticasone (FLONASE) 50 mcg/actuation nasal spray Place 2 sprays into both nostrils once daily as needed for Rhinitis.     hydroCHLOROthiazide (MICROZIDE) 12.5 mg capsule Take 1 capsule (12.5 mg total) by mouth once daily 90 capsule 1   lisinopriL (ZESTRIL) 20 MG tablet Take 1 tablet (20 mg total) by mouth once daily 90 tablet 1   metFORMIN (GLUCOPHAGE) 500 MG tablet Take 1 tablet (500 mg total) by mouth daily with breakfast 90 tablet 1   metoprolol succinate (TOPROL-XL) 25 MG XL tablet Take 0.5 tablets (12.5 mg total) by mouth once daily 45 tablet 3  mineral oil/petrolatum,white (WHITE PETROLATUM-MINERAL OIL OPHTH) APPLY SMALL RIBBON IN [facility] EYE AT BEDTIME     rivaroxaban (XARELTO) 10 mg tablet Take 1 tablet (10 mg total) by mouth daily with breakfast 30 tablet 3   rosuvastatin (CRESTOR) 5 MG tablet Take 2.5 mg by mouth as directed. Three times a week (takes Monday, Wednesday, Friday)     terazosin (HYTRIN) 10 MG capsule Take 1 capsule (10 mg total) by mouth at bedtime 90 capsule 1   timolol maleate (TIMOPTIC) 0.5 % topical drops INSTILL ONE DROP IN RIGHT EYE ONCE EVERY DAY      No current facility-administered medications for this visit.    ALLERGIES: Niacin and Statins-hmg-coa reductase inhibitors  PAST MEDICAL HISTORY: Past Medical History:  Diagnosis Date   BPH (benign prostatic hyperplasia)    Diabetes mellitus (CMS-HCC)    dx 2008   Diverticulitis    Essential hypertension    H/O adenomatous polyp of colon    History of diverticulosis    History of nephrolithiasis    Mitral regurgitation    Myalgia    statin induced   Pure hypercholesterolemia    Traumatic subdural hematoma     PAST SURGICAL HISTORY: Past Surgical History:  Procedure Laterality Date   Colon polypectomy x 6 07/2006     COLONOSCOPY  11/30/09   diverticulosis in sigmoid colon per Dr. Candace Cruise (repeat 71yr)   Evacuation right-sided subdural hematoma 10/13/2003 ([facility])     Right face basal cell carcinoma removal 12/09     TONSILLECTOMY     VASECTOMY       FAMILY HISTORY: History reviewed. No pertinent family history.   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 Weight: 70.8 kg (156 lb)   GENERAL: Alert, active, oriented x3  HEENT: Pupils equal reactive to light. Extraocular movements are intact. Sclera clear. Palpebral conjunctiva normal red color.Pharynx clear.  NECK: Supple with no palpable mass and no adenopathy.  LUNGS: Sound clear with no rales rhonchi or wheezes.  HEART: Regular rhythm  S1 and S2 without murmur.  ABDOMEN: Soft and depressible, nontender with no palpable mass, no hepatomegaly.  Left inguinal hernia, palpable, reducible, mild tender to reduction.  EXTREMITIES: Well-developed well-nourished symmetrical with no dependent edema.  NEUROLOGICAL: Awake alert oriented, facial expression symmetrical, moving all extremities.  REVIEW OF DATA: I have reviewed the following data today: Office Visit on 06/21/2021  Component Date Value   WBC (White Blood Cell Co* 06/21/2021 5.9    RBC (Red Blood Cell Coun* 06/21/2021 4.64 (!)   Hemoglobin 06/21/2021 15.1    Hematocrit 06/21/2021 44.9    MCV (Mean Corpuscular Vo* 06/21/2021 96.8    MCH (Mean Corpuscular He* 06/21/2021 32.5 (!)   MCHC (Mean Corpuscular H* 06/21/2021 33.6    Platelet Count 06/21/2021 127 (!)   RDW-CV (Red Cell Distrib*  06/21/2021 13.9    MPV (Mean Platelet Volum* 06/21/2021 12.6 (!)   Neutrophils 06/21/2021 3.24    Lymphocytes 06/21/2021 1.91    Monocytes 06/21/2021 0.48    Eosinophils 06/21/2021 0.17    Basophils 06/21/2021 0.06    Neutrophil % 06/21/2021 55.2    Lymphocyte % 06/21/2021 32.5    Monocyte % 06/21/2021 8.2    Eosinophil % 06/21/2021 2.9    Basophil% 06/21/2021 1.0    Immature Granulocyte % 06/21/2021 0.2    Immature Granulocyte Cou* 06/21/2021 0.01    Color 06/21/2021 Yellow    Clarity 06/21/2021 Clear    Specific Gravity 06/21/2021 1.015    pH, Urine 06/21/2021 6.0    Protein, Urinalysis 06/21/2021 Negative    Glucose, Urinalysis 06/21/2021 Negative    Ketones, Urinalysis 06/21/2021 Negative    Blood, Urinalysis 06/21/2021 Negative    Nitrite, Urinalysis 06/21/2021 Negative    Leukocyte Esterase, Urin* 06/21/2021 Negative    White Blood Cells, Urina* 06/21/2021 None Seen    Red Blood Cells, Urinaly* 06/21/2021 None Seen    Bacteria, Urinalysis 06/21/2021 Rare (!)   Squamous Epithelial Cell* 06/21/2021 Rare    Glucose 06/21/2021 128 (!)   Sodium 06/21/2021 141     Potassium 06/21/2021 4.4    Chloride 06/21/2021 103    Carbon Dioxide (CO2) 06/21/2021 26.0    Urea Nitrogen (BUN) 06/21/2021 29 (!)   Creatinine 06/21/2021 1.6 (!)   Glomerular Filtration Ra* 06/21/2021 41 (!)   Calcium 06/21/2021 9.6    AST  06/21/2021 18    ALT  06/21/2021 11    Alk Phos (alkaline Phosp* 06/21/2021 48    Albumin 06/21/2021 4.1    Bilirubin, Total 06/21/2021 0.8    Protein, Total 06/21/2021 6.9    A/G Ratio 06/21/2021 1.5   Office Visit on 06/03/2021  Component Date Value   Vent Rate (bpm) 06/03/2021 84    QRS Interval (msec) 06/03/2021 106    QT Interval (msec) 06/03/2021 388    QTc (msec) 06/03/2021 458   Appointment on 05/02/2021  Component Date Value   Color 05/02/2021 Yellow    Clarity 05/02/2021 Clear    Specific Gravity 05/02/2021 1.020    pH, Urine 05/02/2021 5.5    Protein, Urinalysis 05/02/2021 Negative    Glucose, Urinalysis 05/02/2021 Negative    Ketones, Urinalysis 05/02/2021 Negative    Blood, Urinalysis 05/02/2021 Negative    Nitrite, Urinalysis 05/02/2021 Negative    Leukocyte Esterase, Urin* 05/02/2021 Negative    White Blood Cells, Urina* 05/02/2021 None Seen    Red Blood Cells, Urinaly* 05/02/2021 None Seen    Bacteria, Urinalysis 05/02/2021 None Seen    Squamous Epithelial Cell* 05/02/2021 Rare    Urine Culture, Routine -* 05/02/2021 Final report    Result 1 - [facility] 05/02/2021 No growth      ASSESSMENT: Mr. Lawhorn is a 87 y.o. male presenting for consultation for bilateral inguinal hernia.    The patient presents with a symptomatic, reducible left inguinal hernia. Patient was oriented about the diagnosis of inguinal hernia and its implication. The patient was oriented about the treatment alternatives (observation vs surgical repair). Due to patient symptoms, repair is recommended. Patient oriented about the surgical procedure, the use of mesh and its risk of complications such as: infection, bleeding, injury to vas deference,  vasculature and testicle, injury to bowel or bladder, and chronic pain.   Since patient does not have any symptoms on the right groin that was already repaired even though CT scan shows  a suspected fat-containing, small inguinal hernia on the right side patient elected not to have any procedure done on the right side since there is no symptoms.  He does agree to proceed with left inguinal hernia repair.  Non-recurrent unilateral inguinal hernia without obstruction or gangrene [K40.90]  PLAN: 1.  Robotic assisted laparoscopic left inguinal hernia repair with mesh (34758) 2.  CBC, CMP 3.  Hold Eliquis two days before surgery 4.  Cardiology Clearance 5.  Contact us if has any question or concern.   Patient verbalized understanding, all questions were answered, and were agreeable with the plan outlined above.    Herbert Pun, MD  Electronically signed by Herbert Pun, MD

## 2021-07-21 NOTE — H&P (Signed)
PATIENT PROFILE: Craig [name] is a 87 y.o. male who presents to the Clinic for consultation at the request of [name], [place] for evaluation of bilateral inguinal hernia.  PCP:  Dion Body, MD  HISTORY OF PRESENT ILLNESS: [name] reports she is having pain in the left groin.  Pain aggravated by bending and doing heavy lifting.  Pain does not radiate to the part of the body.  There has been elevating factors.  Patient endorsed that he had a right inguinal hernia that had surgery many years ago.  He does not have any pain or any issues on the right side.  He denies any episode of abdominal distention nausea or vomiting.  He denies any other abdominal surgeries.  Patient denies any chest pain.  Patient states that he goes to the gym daily.   PROBLEM LIST: Problem List  Date Reviewed: 07/05/2021          Noted   CKD (chronic kidney disease) stage 3, Cr 1.7, GFR 38 - 03/22/21 04/23/2019   Medicare annual wellness visit, initial: 01/02/13 02/21/2017   Medicare annual wellness visit, subsequent 03/29/21 02/21/2017   Mitral regurgitation - severe (ECHO 08/21/14) Unknown   Essential hypertension Unknown   Pure hypercholesterolemia (LDL 81 - 03/22/21) Unknown   Type 2 diabetes mellitus with hyperlipidemia (A1c 5.9% - 03/22/21) Unknown   Overview    adult onset - dx 2008      Benign prostatic hyperplasia (BPH) with straining on urination Unknown   History of diverticulosis Unknown       GENERAL REVIEW OF SYSTEMS:   General ROS: negative for - chills, fatigue, fever, weight gain or weight loss Allergy and Immunology ROS: negative for - hives  Hematological and Lymphatic ROS: negative for - bleeding problems or bruising, negative for palpable nodes Endocrine ROS: negative for - heat or cold intolerance, hair changes Respiratory ROS: negative for - cough, shortness of breath or wheezing Cardiovascular ROS: no chest pain or palpitations GI ROS: negative for nausea, vomiting, abdominal pain,  diarrhea, positive for constipation Musculoskeletal ROS: Positive for - joint swelling or muscle pain Neurological ROS: negative for - confusion, syncope Dermatological ROS: negative for pruritus and rash Psychiatric: negative for anxiety, depression, difficulty sleeping and memory loss  MEDICATIONS: Current Outpatient Medications  Medication Sig Dispense Refill   apixaban (ELIQUIS) 2.5 mg tablet Take 1 tablet by mouth 2 (two) times daily     blood glucose diagnostic test strip Use 1 strip once a week Use as instructed.       carboxymethylcellulose (REFRESH PLUS) 0.5 % ophthalmic solution INSTILL ONE DROP IN [facility] EYE FOUR TIMES A DAY     cetirizine (ZYRTEC) 10 MG tablet Take 5 mg by mouth nightly as needed for Allergies.     fluorouraciL (EFUDEX) 5 % cream APPLY TO AFFECTED AREAS ON SKIN TWICE A DAY FOR 7 DAYS     fluticasone (FLONASE) 50 mcg/actuation nasal spray Place 2 sprays into both nostrils once daily as needed for Rhinitis.     hydroCHLOROthiazide (MICROZIDE) 12.5 mg capsule Take 1 capsule (12.5 mg total) by mouth once daily 90 capsule 1   lisinopriL (ZESTRIL) 20 MG tablet Take 1 tablet (20 mg total) by mouth once daily 90 tablet 1   metFORMIN (GLUCOPHAGE) 500 MG tablet Take 1 tablet (500 mg total) by mouth daily with breakfast 90 tablet 1   metoprolol succinate (TOPROL-XL) 25 MG XL tablet Take 0.5 tablets (12.5 mg total) by mouth once daily 45 tablet 3  mineral oil/petrolatum,white (WHITE PETROLATUM-MINERAL OIL OPHTH) APPLY SMALL RIBBON IN EACH EYE AT BEDTIME     rivaroxaban (XARELTO) 10 mg tablet Take 1 tablet (10 mg total) by mouth daily with breakfast 30 tablet 3   rosuvastatin (CRESTOR) 5 MG tablet Take 2.5 mg by mouth as directed. Three times a week (takes Monday, Wednesday, Friday)     terazosin (HYTRIN) 10 MG capsule Take 1 capsule (10 mg total) by mouth at bedtime 90 capsule 1   timolol maleate (TIMOPTIC) 0.5 % topical drops INSTILL ONE DROP IN RIGHT EYE ONCE EVERY DAY      No current facility-administered medications for this visit.    ALLERGIES: Niacin and Statins-hmg-coa reductase inhibitors  PAST MEDICAL HISTORY: Past Medical History:  Diagnosis Date   BPH (benign prostatic hyperplasia)    Diabetes mellitus (CMS-HCC)    dx 2008   Diverticulitis    Essential hypertension    H/O adenomatous polyp of colon    History of diverticulosis    History of nephrolithiasis    Mitral regurgitation    Myalgia    statin induced   Pure hypercholesterolemia    Traumatic subdural hematoma     PAST SURGICAL HISTORY: Past Surgical History:  Procedure Laterality Date   Colon polypectomy x 6 07/2006     COLONOSCOPY  11/30/09   diverticulosis in sigmoid colon per Dr. Oh (repeat 5yrs)   Evacuation right-sided subdural hematoma 10/13/2003 ([facility])     Right face basal cell carcinoma removal 12/09     TONSILLECTOMY     VASECTOMY       FAMILY HISTORY: History reviewed. No pertinent family history.   SOCIAL HISTORY: Social History   Socioeconomic History   Marital status: Married  Tobacco Use   Smoking status: Never Smoker   Smokeless tobacco: Never Used  Vaping Use   Vaping Use: Never used  Substance and Sexual Activity   Alcohol use: Yes    Comment: occasional   Drug use: No   Sexual activity: Defer  Social History Narrative   Marital status- Married   Lives with wife   Employment- Retired   Supplements- Centrum Silver   Exercise hx- Goes to [employer] 4-5x/weekly   Religious affliation- None    PHYSICAL EXAM: Vitals:   07/21/21 1046  BP: 122/68  Pulse: 50   Body mass index is 24.43 kg/m. Weight: 70.8 kg (156 lb)   GENERAL: Alert, active, oriented x3  HEENT: Pupils equal reactive to light. Extraocular movements are intact. Sclera clear. Palpebral conjunctiva normal red color.Pharynx clear.  NECK: Supple with no palpable mass and no adenopathy.  LUNGS: Sound clear with no rales rhonchi or wheezes.  HEART: Regular rhythm  S1 and S2 without murmur.  ABDOMEN: Soft and depressible, nontender with no palpable mass, no hepatomegaly.  Left inguinal hernia, palpable, reducible, mild tender to reduction.  EXTREMITIES: Well-developed well-nourished symmetrical with no dependent edema.  NEUROLOGICAL: Awake alert oriented, facial expression symmetrical, moving all extremities.  REVIEW OF DATA: I have reviewed the following data today: Office Visit on 06/21/2021  Component Date Value   WBC (White Blood Cell Co* 06/21/2021 5.9    RBC (Red Blood Cell Coun* 06/21/2021 4.64 (!)   Hemoglobin 06/21/2021 15.1    Hematocrit 06/21/2021 44.9    MCV (Mean Corpuscular Vo* 06/21/2021 96.8    MCH (Mean Corpuscular He* 06/21/2021 32.5 (!)   MCHC (Mean Corpuscular H* 06/21/2021 33.6    Platelet Count 06/21/2021 127 (!)   RDW-CV (Red Cell Distrib*   06/21/2021 13.9    MPV (Mean Platelet Volum* 06/21/2021 12.6 (!)   Neutrophils 06/21/2021 3.24    Lymphocytes 06/21/2021 1.91    Monocytes 06/21/2021 0.48    Eosinophils 06/21/2021 0.17    Basophils 06/21/2021 0.06    Neutrophil % 06/21/2021 55.2    Lymphocyte % 06/21/2021 32.5    Monocyte % 06/21/2021 8.2    Eosinophil % 06/21/2021 2.9    Basophil% 06/21/2021 1.0    Immature Granulocyte % 06/21/2021 0.2    Immature Granulocyte Cou* 06/21/2021 0.01    Color 06/21/2021 Yellow    Clarity 06/21/2021 Clear    Specific Gravity 06/21/2021 1.015    pH, Urine 06/21/2021 6.0    Protein, Urinalysis 06/21/2021 Negative    Glucose, Urinalysis 06/21/2021 Negative    Ketones, Urinalysis 06/21/2021 Negative    Blood, Urinalysis 06/21/2021 Negative    Nitrite, Urinalysis 06/21/2021 Negative    Leukocyte Esterase, Urin* 06/21/2021 Negative    White Blood Cells, Urina* 06/21/2021 None Seen    Red Blood Cells, Urinaly* 06/21/2021 None Seen    Bacteria, Urinalysis 06/21/2021 Rare (!)   Squamous Epithelial Cell* 06/21/2021 Rare    Glucose 06/21/2021 128 (!)   Sodium 06/21/2021 141     Potassium 06/21/2021 4.4    Chloride 06/21/2021 103    Carbon Dioxide (CO2) 06/21/2021 26.0    Urea Nitrogen (BUN) 06/21/2021 29 (!)   Creatinine 06/21/2021 1.6 (!)   Glomerular Filtration Ra* 06/21/2021 41 (!)   Calcium 06/21/2021 9.6    AST  06/21/2021 18    ALT  06/21/2021 11    Alk Phos (alkaline Phosp* 06/21/2021 48    Albumin 06/21/2021 4.1    Bilirubin, Total 06/21/2021 0.8    Protein, Total 06/21/2021 6.9    A/G Ratio 06/21/2021 1.5   Office Visit on 06/03/2021  Component Date Value   Vent Rate (bpm) 06/03/2021 84    QRS Interval (msec) 06/03/2021 106    QT Interval (msec) 06/03/2021 388    QTc (msec) 06/03/2021 458   Appointment on 05/02/2021  Component Date Value   Color 05/02/2021 Yellow    Clarity 05/02/2021 Clear    Specific Gravity 05/02/2021 1.020    pH, Urine 05/02/2021 5.5    Protein, Urinalysis 05/02/2021 Negative    Glucose, Urinalysis 05/02/2021 Negative    Ketones, Urinalysis 05/02/2021 Negative    Blood, Urinalysis 05/02/2021 Negative    Nitrite, Urinalysis 05/02/2021 Negative    Leukocyte Esterase, Urin* 05/02/2021 Negative    White Blood Cells, Urina* 05/02/2021 None Seen    Red Blood Cells, Urinaly* 05/02/2021 None Seen    Bacteria, Urinalysis 05/02/2021 None Seen    Squamous Epithelial Cell* 05/02/2021 Rare    Urine Culture, Routine -* 05/02/2021 Final report    Result 1 - [facility] 05/02/2021 No growth      ASSESSMENT: Mr. Duhamel is a 87 y.o. male presenting for consultation for bilateral inguinal hernia.    The patient presents with a symptomatic, reducible left inguinal hernia. Patient was oriented about the diagnosis of inguinal hernia and its implication. The patient was oriented about the treatment alternatives (observation vs surgical repair). Due to patient symptoms, repair is recommended. Patient oriented about the surgical procedure, the use of mesh and its risk of complications such as: infection, bleeding, injury to vas deference,  vasculature and testicle, injury to bowel or bladder, and chronic pain.   Since patient does not have any symptoms on the right groin that was already repaired even though CT scan shows   a suspected fat-containing, small inguinal hernia on the right side patient elected not to have any procedure done on the right side since there is no symptoms.  He does agree to proceed with left inguinal hernia repair.  Non-recurrent unilateral inguinal hernia without obstruction or gangrene [K40.90]  PLAN: 1.  Robotic assisted laparoscopic left inguinal hernia repair with mesh (34758) 2.  CBC, CMP 3.  Hold Eliquis two days before surgery 4.  Cardiology Clearance 5.  Contact us if has any question or concern.   Patient verbalized understanding, all questions were answered, and were agreeable with the plan outlined above.    Herbert Pun, MD  Electronically signed by Herbert Pun, MD

## 2021-07-28 ENCOUNTER — Other Ambulatory Visit: Payer: Self-pay

## 2021-07-28 ENCOUNTER — Encounter
Admission: RE | Admit: 2021-07-28 | Discharge: 2021-07-28 | Disposition: A | Discharge status: Home / Self Care | Payer: PPO | Source: Ambulatory Visit | Attending: General Surgery | Admitting: General Surgery

## 2021-07-28 DIAGNOSIS — N289 Disorder of kidney and ureter, unspecified: Secondary | ICD-10-CM

## 2021-07-28 DIAGNOSIS — Z01818 Encounter for other preprocedural examination: Secondary | ICD-10-CM

## 2021-07-28 HISTORY — DX: Bradycardia, unspecified: R00.1

## 2021-07-28 HISTORY — DX: Hyperlipidemia, unspecified: E78.5

## 2021-07-28 HISTORY — DX: Gastro-esophageal reflux disease without esophagitis: K21.9

## 2021-07-28 HISTORY — DX: Paroxysmal atrial fibrillation: I48.0

## 2021-07-28 HISTORY — DX: Chronic kidney disease, unspecified: N18.9

## 2021-07-28 HISTORY — DX: Unspecified glaucoma: H40.9

## 2021-07-28 HISTORY — DX: Personal history of urinary calculi: Z87.442

## 2021-07-28 NOTE — Patient Instructions (Signed)
Your procedure is scheduled on:08-03-21 Wednesday Report to the Registration Desk on the 1st floor of the [facility]. To find out your arrival time, please call [phone] between 1PM - 3PM on:08-02-21 Tuesday  REMEMBER: Instructions that are not followed completely may result in serious medical risk, up to and including death; or upon the discretion of your surgeon and anesthesiologist your surgery may need to be rescheduled.  Do not eat food after midnight the night before surgery.  No gum chewing, lozengers or hard candies.  You may however, drink Water up to 2 hours before you are scheduled to arrive for your surgery. Do not drink anything within 2 hours of your scheduled arrival time.  Type 1 and Type 2 diabetics should only drink water.  TAKE THESE MEDICATIONS THE MORNING OF SURGERY WITH A SIP OF WATER: -metoprolol succinate (TOPROL-XL) 12.5 MG 24 hr tablet  Stop your metFORMIN (GLUCOPHAGE) 500 MG tablet 2 days prior to surgery-Last dose on 07-31-21 Sunday  Call Dr Lawernce Keas office today (07-28-21) to clarify when you need to stop your apixaban (ELIQUIS) 2.5 MG TABS tablet  One week prior to surgery: Stop Anti-inflammatories (NSAIDS) such as Advil, Aleve, Ibuprofen, Motrin, Naproxen, Naprosyn and Aspirin based products such as Excedrin, Goodys Powder, BC Powder.You may however, take Tylenol if needed for pain up until the day of surgery.  Stop ANY OVER THE COUNTER supplements/vitamins NOW (07-28-21) until after surgery.  No Alcohol for 24 hours before or after surgery.  No Smoking including e-cigarettes for 24 hours prior to surgery.  No chewable tobacco products for at least 6 hours prior to surgery.  No nicotine patches on the day of surgery.  Do not use any "recreational" drugs for at least a week prior to your surgery.  Please be advised that the combination of cocaine and anesthesia may have negative outcomes, up to and including death. If you test positive for  cocaine, your surgery will be cancelled.  On the morning of surgery brush your teeth with toothpaste and water, you may rinse your mouth with mouthwash if you wish. Do not swallow any toothpaste or mouthwash.  Use CHG Soap as directed on instruction sheet.  Do not wear jewelry, make-up, hairpins, clips or nail polish.  Do not wear lotions, powders, or perfumes.   Do not shave body from the neck down 48 hours prior to surgery just in case you cut yourself which could leave a site for infection.  Also, freshly shaved skin may become irritated if using the CHG soap.  Contact lenses, hearing aids and dentures may not be worn into surgery.  Do not bring valuables to the hospital. [facility] is not responsible for any missing/lost belongings or valuables.   Notify your doctor if there is any change in your medical condition (cold, fever, infection).  Wear comfortable clothing (specific to your surgery type) to the hospital.  After surgery, you can help prevent lung complications by doing breathing exercises.  Take deep breaths and cough every 1-2 hours. Your doctor may order a device called an Incentive Spirometer to help you take deep breaths. When coughing or sneezing, hold a pillow firmly against your incision with both hands. This is called "splinting." Doing this helps protect your incision. It also decreases belly discomfort.  If you are being admitted to the hospital overnight, leave your suitcase in the car. After surgery it may be brought to your room.  If you are being discharged the day of surgery, you will not  be allowed to drive home. You will need a responsible adult (18 years or older) to drive you home and stay with you that night.   If you are taking public transportation, you will need to have a responsible adult (18 years or older) with you. Please confirm with your physician that it is acceptable to use public transportation.   Please call the [facility]  Dept. at [phone] if you have any questions about these instructions.  Surgery Visitation Policy:  Patients undergoing a surgery or procedure may have one family member or support person with them as long as that person is not COVID-19 positive or experiencing its symptoms.  That person may remain in the waiting area during the procedure and may rotate out with other people.  Inpatient Visitation:    Visiting hours are 7 a.m. to 8 p.m. Up to two visitors ages 16+ are allowed at one time in a patient room. The visitors may rotate out with other people during the day. Visitors must check out when they leave, or other visitors will not be allowed. One designated support person may remain overnight. The visitor must pass COVID-19 screenings, use hand sanitizer when entering and exiting the patient's room and wear a mask at all times, including in the patient's room. Patients must also wear a mask when staff or their visitor are in the room. Masking is required regardless of vaccination status.

## 2021-07-29 ENCOUNTER — Encounter
Admission: RE | Admit: 2021-07-29 | Discharge: 2021-07-29 | Disposition: A | Discharge status: Home / Self Care | Payer: PPO | Source: Ambulatory Visit | Attending: General Surgery | Admitting: General Surgery

## 2021-07-29 ENCOUNTER — Encounter: Payer: Self-pay | Admitting: General Surgery

## 2021-07-29 DIAGNOSIS — N289 Disorder of kidney and ureter, unspecified: Secondary | ICD-10-CM | POA: Insufficient documentation

## 2021-07-29 DIAGNOSIS — Z01818 Encounter for other preprocedural examination: Secondary | ICD-10-CM | POA: Diagnosis not present

## 2021-07-29 LAB — POTASSIUM: Potassium: 3.9 mmol/L (ref 3.5–5.1)

## 2021-07-29 NOTE — Progress Notes (Signed)
Perioperative Services  Pre-Admission/Anesthesia Testing Clinical Review  Date: 07/29/21  Patient Demographics:  Name: Craig [name] DOB:   02-Nov-1931 MRN:   [id number]  Planned Surgical Procedure(s):    Case: [id number] Date/Time: 08/03/21 0815   Procedure: XI ROBOTIC ASSISTED INGUINAL HERNIA REPAIR WITH MESH (Left: Groin)   Anesthesia type: General   Pre-op diagnosis: K40.90 non recurrent unilateral inguinal hernia w/o obstruction or gangrene   Location: [facility] OR ROOM 06 / [facility]   Surgeons: Herbert Pun, MD   NOTE: Available PAT nursing documentation and vital signs have been reviewed. Clinical nursing staff has updated patient's PMH/[name], current medication list, and drug allergies/intolerances to ensure comprehensive history available to assist in medical decision making as it pertains to the aforementioned surgical procedure and anticipated anesthetic course. Extensive review of available clinical information performed. Craig [name] updated with any diagnoses/procedures that  may have been inadvertently omitted during his intake with the pre-admission testing department's nursing staff.  Clinical Discussion:  Craig [name] is a 87 y.o. male who is submitted for pre-surgical anesthesia review and clearance prior to him undergoing the above procedure. Patient has never been a smoker. Pertinent PMH includes: PAF, bradycardia, valvular regurgitation, aortic atherosclerosis, HTN, HLD, T2DM, CKD-III, GERD (no daily Tx), BPH.  Patient is followed by cardiology Clayborn Bigness, MD). He was last seen in the cardiology clinic on 06/03/2021; notes reviewed. At the time of his clinic visit, the patient denied any chest pain, shortness of breath, PND, orthopnea, palpitations, significant peripheral edema, vertiginous symptoms, or presyncope/syncope.  Patient with a PMH significant for paroxysmal atrial fibrillation; CHA2DS2-VASc Score = 5 (age x 2, HTN, aortic plaque,  T2DM).  Rate and rhythm managed on metoprolol monotherapy.  Patient is chronically anticoagulated using daily dose of apixaban; compliant with therapy with no evidence or reports of GI bleeding.  Last TTE was performed on 08/09/2018 revealing a reduced LVEF of 40-45%, BAE, mild RV enlargement, and moderate mitral valve regurgitation. Blood pressure well controlled at 120/66 on currently prescribed diuretic, ACEi, and beta-blocker therapies.  Patient is taking a statin for his HLD.  T2DM well-controlled on currently prescribed regimen; last Hgb A1c was 5.9% when checked on 03/22/2021.  No changes were made to his medication regimen.  Patient follow-up with outpatient cardiology in 3 months or sooner if needed.  Craig [name] is scheduled for an elective ROBOT ASSISTED INGUINAL HERNIA REPAIR WITH MESH on 08/03/2021 with Dr. Herbert Pun, MD. Given patient's past medical history significant for cardiovascular diagnoses, presurgical cardiac clearance was sought by the performing surgeon's office and PAT team. Per cardiology, "this patient is optimized for surgery and may proceed with the planned procedural course with a MODERATE risk of significant perioperative cardiovascular complications".  Again, this patient is on daily anticoagulation antiplatelet therapy. He has been instructed on recommendations for holding his apixaban for 3 days prior to his procedure with plans to restart as soon as postoperative bleeding risk felt to be minimized by his attending surgeon. The patient has been instructed that his last dose of his anticoagulant will be on 07/30/2021.  Patient denies previous perioperative complications with anesthesia in the past.  In review his EMR, there are no records available for review pertaining to past procedural/anesthetic courses within the [facility] system.  Providers/Specialists:   NOTE: Primary physician provider listed below. Patient may have been seen by APP or partner within  same practice.   PROVIDER ROLE / SPECIALTY LAST OV  Cintron-Diaz,  Reeves Forth, MD GENERAL SURGERY 07/21/2021  Dion Body, MD PRIMARY CARE PROVIDER 06/21/2021  Katrine Coho, MD CARDIOLOGY 06/03/2021   Allergies:  Statins  Current Home Medications:   No current facility-administered medications for this encounter.    apixaban (ELIQUIS) 2.5 MG TABS tablet   hydrochlorothiazide (MICROZIDE) 12.5 MG capsule   lisinopril (ZESTRIL) 20 MG tablet   metFORMIN (GLUCOPHAGE) 500 MG tablet   metoprolol succinate (TOPROL-XL) 25 MG 24 hr tablet   rosuvastatin (CRESTOR) 5 MG tablet   terazosin (HYTRIN) 10 MG capsule   timolol (BETIMOL) 0.5 % ophthalmic solution   History:   Past Medical History:  Diagnosis Date   Aortic atherosclerosis (HCC)    Basal cell carcinoma of skin    BPH (benign prostatic hyperplasia)    Bradycardia    CKD (chronic kidney disease), stage III (HCC)    Current use of long term anticoagulation    a.) Apixaban   Diverticulosis    GERD (gastroesophageal reflux disease)    Glaucoma    History of kidney stones    Hyperlipidemia    Hypertension    Myalgia due to statin    Paroxysmal atrial fibrillation (HCC)    a.) CHA2DS2-VASc =5 (age x 2, HTN, aortic plaque, T2DM). b.) rate/rhythm maintained on metoprolol. c.) chronically anticoagulated using reduced dose apixaban.   T2DM (type 2 diabetes mellitus) ([place])    Traumatic subdural hematoma    Valvular regurgitation 08/21/2014   a.) TTE 08/21/2014: EF >55%; BAE, mod PHTN; mild AP/PR, mod TR, severe MR. b.) TTE 08/29/2016: EF 55%; triv AR, mild TR, moderate MR/PR. c.) TTE 08/09/2018: EF 40-45%; BAE, mild RV enlargement; trivial AR, mild TR/PR, moderate MR.   Past Surgical History:  Procedure Laterality Date   COLONOSCOPY W/ POLYPECTOMY     SUBDURAL HEMATOMA EVACUATION VIA CRANIOTOMY  2007   trimming tree and branch fell on head   TONSILLECTOMY     VASECTOMY     No family history on file. Social History    Tobacco Use   Smoking status: Never   Smokeless tobacco: Never  Vaping Use   Vaping Use: Never used  Substance Use Topics   Alcohol use: Yes    Comment: occ   Drug use: Never    Pertinent Clinical Results:  LABS: Labs reviewed: Acceptable for surgery.  Hospital Outpatient Visit on 07/29/2021  Component Date Value Ref Range Status   Potassium 07/29/2021 3.9  3.5 - 5.1 mmol/L Final   Performed at [facility], [address] [place], [place] [address]    Ref Range & Units 06/22/2021  WBC (White Blood Cell Count) 4.1 - 10.2 10^3/uL 5.9   RBC (Red Blood Cell Count) 4.69 - 6.13 10^6/uL 4.64 Low    Hemoglobin 14.1 - 18.1 gm/dL 15.1   Hematocrit 40.0 - 52.0 % 44.9   MCV (Mean Corpuscular Volume) 80.0 - 100.0 fl 96.8   MCH (Mean Corpuscular Hemoglobin) 27.0 - 31.2 pg 32.5 High    MCHC (Mean Corpuscular Hemoglobin Concentration) 32.0 - 36.0 gm/dL 33.6   Platelet Count 150 - 450 10^3/uL 127 Low    RDW-CV (Red Cell Distribution Width) 11.6 - 14.8 % 13.9   MPV (Mean Platelet Volume) 9.4 - 12.4 fl 12.6 High    Neutrophils 1.50 - 7.80 10^3/uL 3.24   Lymphocytes 1.00 - 3.60 10^3/uL 1.91   Monocytes 0.00 - 1.50 10^3/uL 0.48   Eosinophils 0.00 - 0.55 10^3/uL 0.17   Basophils 0.00 - 0.09 10^3/uL 0.06   Neutrophil %  32.0 - 70.0 % 55.2   Lymphocyte % 10.0 - 50.0 % 32.5   Monocyte % 4.0 - 13.0 % 8.2   Eosinophil % 1.0 - 5.0 % 2.9   Basophil% 0.0 - 2.0 % 1.0   Immature Granulocyte % <=0.7 % 0.2   Immature Granulocyte Count <=0.06 10^3/L 0.01   Resulting Agency  [facility]  Specimen Collected: 06/21/21 10:46 Last Resulted: 06/21/21 11:08  Received From: [facility]  Result Received: 06/23/21 11:21    Ref Range & Units 06/22/2021  Glucose 70 - 110 mg/dL 128 High    Sodium 136 - 145 mmol/L 141   Potassium 3.6 - 5.1 mmol/L 4.4   Chloride 97 - 109 mmol/L 103   Carbon Dioxide (CO2) 22.0 - 32.0 mmol/L 26.0   Urea Nitrogen (BUN) 7 - 25 mg/dL 29  High    Creatinine 0.7 - 1.3 mg/dL 1.6 High    Glomerular Filtration Rate (eGFR), MDRD Estimate >60 mL/min/1.73sq m 41 Low    Calcium 8.7 - 10.3 mg/dL 9.6   AST  8 - 39 U/L 18   ALT  6 - 57 U/L 11   Alk Phos (alkaline Phosphatase) 34 - 104 U/L 48   Albumin 3.5 - 4.8 g/dL 4.1   Bilirubin, Total 0.3 - 1.2 mg/dL 0.8   Protein, Total 6.1 - 7.9 g/dL 6.9   A/G Ratio 1.0 - 5.0 gm/dL 1.5   Resulting Agency  [facility]  Specimen Collected: 06/21/21 10:46 Last Resulted: 06/22/21 10:37  Received From: [facility]  Result Received: 06/23/21 11:21    Ref Range & Units 03/22/2021  Hemoglobin A1C 4.2 - 5.6 % 5.9 High    Average Blood Glucose (Calc) mg/dL 123   Resulting Agency  [facility]    ECG: Date: 06/03/2021 Rate: 84 bpm Rhythm: AJR with occasional PVCs Intervals: QRS 106 ms. QTc 458 ms. ST segment and T wave changes: No evidence of acute ST segment elevation or depression.  Evidence of an age undetermined anterior infarct noted. Comparison: AJR has replaced sinus rhythm when compared to tracing performed on 10/27/2019    IMAGING / PROCEDURES: CT ABDOMEN PELVIS WITH CONTRAST performed on 07/14/2021 Stable small fat containing right inguinal hernia is noted.  Moderate size fat containing left inguinal hernia is noted which is enlarged compared to prior exam. 14 mm left inguinal lymph node is noted which is enlarged compared to prior exam this may simply be reactive or inflammatory in etiology, but short-term follow-up ultrasound or tissue sampling is recommended to rule out other pathology. Sigmoid diverticulosis without inflammation. Aortic atherosclerosis   TRANSTHORACIC ECHOCARDIOGRAM performed on 08/09/2018 LVEF 40-45% Mild left ventricular systolic dysfunction Normal right ventricular systolic function Mild biatrial enlargement Mild right ventricular enlargement Trivial AR Mild TR and PR Moderate MR No evidence of valvular  stenosis No pericardial effusion  Impression and Plan:  Craig [name] has been referred for pre-anesthesia review and clearance prior to him undergoing the planned anesthetic and procedural courses. Available labs, pertinent testing, and imaging results were personally reviewed by me. This patient has been appropriately cleared by cardiology with an overall MODERATE risk of significant perioperative cardiovascular complications.  Based on clinical review performed today (07/29/21), barring any significant acute changes in the patient's overall condition, it is anticipated that he will be able to proceed with the planned surgical intervention. Any acute changes in clinical condition may necessitate his procedure being postponed  and/or cancelled. Patient will meet with anesthesia team (MD and/or CRNA) on the day of his procedure for preoperative evaluation/assessment. Questions regarding anesthetic course will be fielded at that time.   Pre-surgical instructions were reviewed with the patient during his PAT appointment and questions were fielded by PAT clinical staff. Patient was advised that if any questions or concerns arise prior to his procedure then he should return a call to PAT and/or his surgeon's office to discuss.  Honor Loh, MSN, APRN, FNP-C, CEN [facility]  Peri-operative Services Nurse Practitioner Phone: [phone] Fax: [phone] 07/29/21 11:54 AM  NOTE: This note has been prepared using Dragon dictation software. Despite my best ability to proofread, there is always the potential that unintentional transcriptional errors may still occur from this process.

## 2021-08-03 ENCOUNTER — Ambulatory Visit: Payer: PPO | Admitting: Urgent Care

## 2021-08-03 ENCOUNTER — Encounter
Admission: RE | Disposition: A | Discharge status: Home / Self Care | Payer: Self-pay | Source: Home / Self Care | Attending: General Surgery

## 2021-08-03 ENCOUNTER — Other Ambulatory Visit: Payer: Self-pay

## 2021-08-03 ENCOUNTER — Ambulatory Visit
Admission: RE | Admit: 2021-08-03 | Discharge: 2021-08-03 | Disposition: A | Discharge status: Home / Self Care | Payer: PPO | Attending: General Surgery | Admitting: General Surgery

## 2021-08-03 DIAGNOSIS — Z888 Allergy status to other drugs, medicaments and biological substances status: Secondary | ICD-10-CM | POA: Insufficient documentation

## 2021-08-03 DIAGNOSIS — K4090 Unilateral inguinal hernia, without obstruction or gangrene, not specified as recurrent: Secondary | ICD-10-CM | POA: Diagnosis not present

## 2021-08-03 DIAGNOSIS — E785 Hyperlipidemia, unspecified: Secondary | ICD-10-CM | POA: Diagnosis not present

## 2021-08-03 DIAGNOSIS — Z7984 Long term (current) use of oral hypoglycemic drugs: Secondary | ICD-10-CM | POA: Insufficient documentation

## 2021-08-03 DIAGNOSIS — Z7901 Long term (current) use of anticoagulants: Secondary | ICD-10-CM | POA: Insufficient documentation

## 2021-08-03 DIAGNOSIS — Z79899 Other long term (current) drug therapy: Secondary | ICD-10-CM | POA: Diagnosis not present

## 2021-08-03 HISTORY — DX: Benign prostatic hyperplasia without lower urinary tract symptoms: N40.0

## 2021-08-03 HISTORY — DX: Type 2 diabetes mellitus without complications: E11.9

## 2021-08-03 HISTORY — DX: Atherosclerosis of aorta: I70.0

## 2021-08-03 HISTORY — DX: Diverticulosis of intestine, part unspecified, without perforation or abscess without bleeding: K57.90

## 2021-08-03 HISTORY — DX: Basal cell carcinoma of skin, unspecified: C44.91

## 2021-08-03 HISTORY — DX: Long term (current) use of anticoagulants: Z79.01

## 2021-08-03 HISTORY — DX: Traumatic subdural hemorrhage with loss of consciousness status unknown, initial encounter: S06.5XAA

## 2021-08-03 HISTORY — DX: Myalgia, unspecified site: M79.10

## 2021-08-03 HISTORY — PX: XI ROBOTIC ASSISTED INGUINAL HERNIA REPAIR WITH MESH: SHX6706

## 2021-08-03 HISTORY — DX: Chronic kidney disease, stage 3 unspecified: N18.30

## 2021-08-03 HISTORY — DX: Adverse effect of antihyperlipidemic and antiarteriosclerotic drugs, initial encounter: T46.6X5A

## 2021-08-03 LAB — GLUCOSE, CAPILLARY
Glucose-Capillary: 130 mg/dL — ABNORMAL HIGH (ref 70–99)
Glucose-Capillary: 169 mg/dL — ABNORMAL HIGH (ref 70–99)

## 2021-08-03 SURGERY — REPAIR, HERNIA, INGUINAL, ROBOT-ASSISTED, LAPAROSCOPIC, USING MESH
Anesthesia: General | Site: Groin | Laterality: Left

## 2021-08-03 MED ORDER — HYDROCODONE-ACETAMINOPHEN 5-325 MG PO TABS: 1.0000 | ORAL_TABLET | ORAL | 0 refills | Status: AC | PRN

## 2021-08-03 MED ORDER — DEXAMETHASONE SODIUM PHOSPHATE 10 MG/ML IJ SOLN
INTRAMUSCULAR | Status: DC | PRN
  Administered 2021-08-03: 5 mg via INTRAVENOUS

## 2021-08-03 MED ORDER — LIDOCAINE HCL (PF) 2 % IJ SOLN
INTRAMUSCULAR | Status: AC
  Filled 2021-08-03: qty 5

## 2021-08-03 MED ORDER — PHENYLEPHRINE HCL (PRESSORS) 10 MG/ML IV SOLN
INTRAVENOUS | Status: DC | PRN
  Administered 2021-08-03 (×2): 40 ug via INTRAVENOUS

## 2021-08-03 MED ORDER — ONDANSETRON HCL 4 MG/2ML IJ SOLN
INTRAMUSCULAR | Status: AC
  Filled 2021-08-03: qty 2

## 2021-08-03 MED ORDER — CHLORHEXIDINE GLUCONATE 0.12 % MT SOLN: 15.0000 mL | Freq: Once | OROMUCOSAL | Status: AC

## 2021-08-03 MED ORDER — FENTANYL CITRATE (PF) 100 MCG/2ML IJ SOLN: 25.0000 ug | INTRAMUSCULAR | Status: DC | PRN

## 2021-08-03 MED ORDER — ONDANSETRON HCL 4 MG/2ML IJ SOLN: 4.0000 mg | Freq: Once | INTRAMUSCULAR | Status: DC | PRN

## 2021-08-03 MED ORDER — LIDOCAINE HCL (CARDIAC) PF 100 MG/5ML IV SOSY
PREFILLED_SYRINGE | INTRAVENOUS | Status: DC | PRN
  Administered 2021-08-03: 60 mg via INTRAVENOUS

## 2021-08-03 MED ORDER — GLYCOPYRROLATE 0.2 MG/ML IJ SOLN
INTRAMUSCULAR | Status: DC | PRN
  Administered 2021-08-03 (×2): .2 mg via INTRAVENOUS

## 2021-08-03 MED ORDER — FAMOTIDINE 20 MG PO TABS: 20.0000 mg | ORAL_TABLET | Freq: Once | ORAL | Status: AC

## 2021-08-03 MED ORDER — ACETAMINOPHEN 10 MG/ML IV SOLN
INTRAVENOUS | Status: DC | PRN
  Administered 2021-08-03: 1000 mg via INTRAVENOUS

## 2021-08-03 MED ORDER — SEVOFLURANE IN SOLN
RESPIRATORY_TRACT | Status: AC
  Filled 2021-08-03: qty 250

## 2021-08-03 MED ORDER — SUGAMMADEX SODIUM 200 MG/2ML IV SOLN
INTRAVENOUS | Status: DC | PRN
  Administered 2021-08-03: 150 mg via INTRAVENOUS
  Administered 2021-08-03 (×2): 25 mg via INTRAVENOUS

## 2021-08-03 MED ORDER — PROPOFOL 10 MG/ML IV BOLUS
INTRAVENOUS | Status: AC
  Filled 2021-08-03: qty 40

## 2021-08-03 MED ORDER — ROCURONIUM BROMIDE 100 MG/10ML IV SOLN
INTRAVENOUS | Status: DC | PRN
  Administered 2021-08-03: 50 mg via INTRAVENOUS
  Administered 2021-08-03: 20 mg via INTRAVENOUS

## 2021-08-03 MED ORDER — ROCURONIUM BROMIDE 10 MG/ML (PF) SYRINGE
PREFILLED_SYRINGE | INTRAVENOUS | Status: AC
  Filled 2021-08-03: qty 10

## 2021-08-03 MED ORDER — ACETAMINOPHEN 10 MG/ML IV SOLN
INTRAVENOUS | Status: AC
  Filled 2021-08-03: qty 100

## 2021-08-03 MED ORDER — PROPOFOL 10 MG/ML IV BOLUS
INTRAVENOUS | Status: DC | PRN
  Administered 2021-08-03: 100 mg via INTRAVENOUS

## 2021-08-03 MED ORDER — FENTANYL CITRATE (PF) 100 MCG/2ML IJ SOLN
INTRAMUSCULAR | Status: AC
  Filled 2021-08-03: qty 2

## 2021-08-03 MED ORDER — 0.9 % SODIUM CHLORIDE (POUR BTL) OPTIME
TOPICAL | Status: DC | PRN
  Administered 2021-08-03: 500 mL

## 2021-08-03 MED ORDER — CEFAZOLIN SODIUM-DEXTROSE 2-4 GM/100ML-% IV SOLN
2.0000 g | INTRAVENOUS | Status: AC
  Administered 2021-08-03: 2 g via INTRAVENOUS

## 2021-08-03 MED ORDER — PHENYLEPHRINE HCL-NACL 20-0.9 MG/250ML-% IV SOLN
INTRAVENOUS | Status: AC
  Filled 2021-08-03: qty 250

## 2021-08-03 MED ORDER — CHLORHEXIDINE GLUCONATE 0.12 % MT SOLN
OROMUCOSAL | Status: AC
  Administered 2021-08-03: 15 mL via OROMUCOSAL
  Filled 2021-08-03: qty 15

## 2021-08-03 MED ORDER — FAMOTIDINE 20 MG PO TABS
ORAL_TABLET | ORAL | Status: AC
  Administered 2021-08-03: 20 mg via ORAL
  Filled 2021-08-03: qty 1

## 2021-08-03 MED ORDER — BUPIVACAINE-EPINEPHRINE (PF) 0.25% -1:200000 IJ SOLN
INTRAMUSCULAR | Status: AC
  Filled 2021-08-03: qty 30

## 2021-08-03 MED ORDER — EPHEDRINE SULFATE 50 MG/ML IJ SOLN
INTRAMUSCULAR | Status: DC | PRN
  Administered 2021-08-03: 5 mg via INTRAVENOUS
  Administered 2021-08-03 (×2): 10 mg via INTRAVENOUS

## 2021-08-03 MED ORDER — SODIUM CHLORIDE 0.9 % IV SOLN: INTRAVENOUS | Status: DC

## 2021-08-03 MED ORDER — ONDANSETRON HCL 4 MG/2ML IJ SOLN
INTRAMUSCULAR | Status: DC | PRN
  Administered 2021-08-03: 4 mg via INTRAVENOUS

## 2021-08-03 MED ORDER — ORAL CARE MOUTH RINSE: 15.0000 mL | Freq: Once | OROMUCOSAL | Status: AC

## 2021-08-03 MED ORDER — FENTANYL CITRATE (PF) 100 MCG/2ML IJ SOLN
INTRAMUSCULAR | Status: DC | PRN
  Administered 2021-08-03 (×2): 50 ug via INTRAVENOUS

## 2021-08-03 MED ORDER — BUPIVACAINE-EPINEPHRINE 0.25% -1:200000 IJ SOLN
INTRAMUSCULAR | Status: DC | PRN
  Administered 2021-08-03: 30 mL

## 2021-08-03 MED ORDER — DEXAMETHASONE SODIUM PHOSPHATE 10 MG/ML IJ SOLN
INTRAMUSCULAR | Status: AC
  Filled 2021-08-03: qty 1

## 2021-08-03 MED ORDER — CEFAZOLIN SODIUM-DEXTROSE 2-4 GM/100ML-% IV SOLN
INTRAVENOUS | Status: AC
  Filled 2021-08-03: qty 100

## 2021-08-03 SURGICAL SUPPLY — 52 items
ADH SKN CLS APL DERMABOND .7 (GAUZE/BANDAGES/DRESSINGS) ×1
APL PRP STRL LF DISP 70% ISPRP (MISCELLANEOUS) ×1
BAG INFUSER PRESSURE 100CC (MISCELLANEOUS) IMPLANT
BLADE SURG SZ11 CARB STEEL (BLADE) ×2 IMPLANT
CHLORAPREP W/TINT 26 (MISCELLANEOUS) ×2 IMPLANT
COVER TIP SHEARS 8 DVNC (MISCELLANEOUS) ×1 IMPLANT
COVER TIP SHEARS 8MM DA VINCI (MISCELLANEOUS) ×1
COVER WAND RF STERILE (DRAPES) ×2 IMPLANT
DEFOGGER SCOPE WARMER CLEARIFY (MISCELLANEOUS) ×2 IMPLANT
DERMABOND ADVANCED (GAUZE/BANDAGES/DRESSINGS) ×1
DERMABOND ADVANCED .7 DNX12 (GAUZE/BANDAGES/DRESSINGS) ×1 IMPLANT
DRAPE ARM DVNC X/XI (DISPOSABLE) ×3 IMPLANT
DRAPE COLUMN DVNC XI (DISPOSABLE) ×1 IMPLANT
DRAPE DA VINCI XI ARM (DISPOSABLE) ×3
DRAPE DA VINCI XI COLUMN (DISPOSABLE) ×1
ELECT REM PT RETURN 9FT ADLT (ELECTROSURGICAL) ×2
ELECTRODE REM PT RTRN 9FT ADLT (ELECTROSURGICAL) ×1 IMPLANT
GAUZE 4X4 16PLY ~~LOC~~+RFID DBL (SPONGE) ×2 IMPLANT
GLOVE SURG ENC MOIS LTX SZ6.5 (GLOVE) ×4 IMPLANT
GLOVE SURG UNDER POLY LF SZ6.5 (GLOVE) ×4 IMPLANT
GOWN STRL REUS W/ TWL LRG LVL3 (GOWN DISPOSABLE) ×3 IMPLANT
GOWN STRL REUS W/TWL LRG LVL3 (GOWN DISPOSABLE) ×6
IRRIGATOR SUCT 8 DISP DVNC XI (IRRIGATION / IRRIGATOR) IMPLANT
IRRIGATOR SUCTION 8MM XI DISP (IRRIGATION / IRRIGATOR)
IV CATH ANGIO 12GX3 LT BLUE (NEEDLE) ×2 IMPLANT
IV NS 1000ML (IV SOLUTION)
IV NS 1000ML BAXH (IV SOLUTION) IMPLANT
KIT PINK PAD W/HEAD ARE REST (MISCELLANEOUS) ×2
KIT PINK PAD W/HEAD ARM REST (MISCELLANEOUS) ×1 IMPLANT
LABEL OR SOLS (LABEL) ×2 IMPLANT
MANIFOLD NEPTUNE II (INSTRUMENTS) IMPLANT
MESH 3DMAX 4X6 LT LRG (Mesh General) ×1 IMPLANT
MESH 3DMAX MID 4X6 LT LRG (Mesh General) ×1 IMPLANT
NEEDLE HYPO 22GX1.5 SAFETY (NEEDLE) ×2 IMPLANT
NEEDLE INSUFFLATION 14GA 120MM (NEEDLE) ×2 IMPLANT
OBTURATOR OPTICAL STANDARD 8MM (TROCAR) ×1
OBTURATOR OPTICAL STND 8 DVNC (TROCAR) ×1
OBTURATOR OPTICALSTD 8 DVNC (TROCAR) ×1 IMPLANT
PACK LAP CHOLECYSTECTOMY (MISCELLANEOUS) ×2 IMPLANT
SEAL CANN UNIV 5-8 DVNC XI (MISCELLANEOUS) ×3 IMPLANT
SEAL XI 5MM-8MM UNIVERSAL (MISCELLANEOUS) ×3
SET TUBE SMOKE EVAC HIGH FLOW (TUBING) ×2 IMPLANT
SOLUTION ELECTROLUBE (MISCELLANEOUS) ×2 IMPLANT
SUT MNCRL 4-0 (SUTURE) ×2
SUT MNCRL 4-0 27XMFL (SUTURE) ×1
SUT VIC AB 2-0 SH 27 (SUTURE) ×2
SUT VIC AB 2-0 SH 27XBRD (SUTURE) ×1 IMPLANT
SUT VLOC 90 S/L VL9 GS22 (SUTURE) ×2 IMPLANT
SUTURE MNCRL 4-0 27XMF (SUTURE) ×1 IMPLANT
TAPE TRANSPORE STRL 2 31045 (GAUZE/BANDAGES/DRESSINGS) IMPLANT
TRAY FOLEY MTR SLVR 16FR STAT (SET/KITS/TRAYS/PACK) IMPLANT
WATER STERILE IRR 500ML POUR (IV SOLUTION) ×2 IMPLANT

## 2021-08-03 NOTE — Anesthesia Procedure Notes (Addendum)
Procedure Name: Intubation Date/Time: 08/03/2021 9:02 AM Performed by: Justin Mend, RN Pre-anesthesia Checklist: Patient identified, Emergency Drugs available, Suction available and Patient being monitored Patient Re-evaluated:Patient Re-evaluated prior to induction Oxygen Delivery Method: Circle system utilized Preoxygenation: Pre-oxygenation with 100% oxygen Induction Type: IV induction Ventilation: Mask ventilation without difficulty Laryngoscope Size: McGraph and 4 Grade View: Grade I Tube type: Oral Tube size: 7.0 mm Number of attempts: 1 Airway Equipment and Method: Stylet and Oral airway Placement Confirmation: ETT inserted through vocal cords under direct vision, positive ETCO2 and breath sounds checked- equal and bilateral Secured at: 23 cm Tube secured with: Tape Dental Injury: Teeth and Oropharynx as per pre-operative assessment

## 2021-08-03 NOTE — Transfer of Care (Signed)
Immediate Anesthesia Transfer of Care Note  Patient: Craig [name]  Procedure(s) Performed: XI ROBOTIC ASSISTED INGUINAL HERNIA REPAIR WITH MESH (Left: Groin)  Patient Location: PACU  Anesthesia Type:General  Level of Consciousness: awake and alert   Airway & Oxygen Therapy: Patient Spontanous Breathing and Patient connected to face mask oxygen  Post-op Assessment: Report given to RN and Post -op Vital signs reviewed and stable  Post vital signs: Reviewed  Last Vitals:  Vitals Value Taken Time  BP 142/74 08/03/21 1050  Temp    Pulse 59 08/03/21 1052  Resp 18 08/03/21 1052  SpO2 99 % 08/03/21 1052  Vitals shown include unvalidated device data.  Last Pain:  Vitals:   08/03/21 0730  TempSrc: Tympanic  PainSc: 0-No pain         Complications: No notable events documented.

## 2021-08-03 NOTE — Progress Notes (Signed)
Patient unable to void after numerous tries. Bladder scanned performed x3 with last result of 68 mL after 4 hours. Dr. Windell Moment was notified and was ok with pt. discharging but if pt. has not voided within 8 hours that he would need to return to the ER. Wife made aware and is agreeable.

## 2021-08-03 NOTE — Discharge Instructions (Addendum)

## 2021-08-03 NOTE — Anesthesia Postprocedure Evaluation (Signed)
Anesthesia Post Note  Patient: Craig [name]  Procedure(s) Performed: XI ROBOTIC ASSISTED INGUINAL HERNIA REPAIR WITH MESH (Left: Groin)  Patient location during evaluation: PACU Anesthesia Type: General Level of consciousness: awake and alert, oriented and patient cooperative Pain management: pain level controlled Vital Signs Assessment: post-procedure vital signs reviewed and stable Respiratory status: spontaneous breathing, nonlabored ventilation and respiratory function stable Cardiovascular status: blood pressure returned to baseline and stable Postop Assessment: adequate PO intake Anesthetic complications: no   No notable events documented.   Last Vitals:  Vitals:   08/03/21 1136 08/03/21 1145  BP:  138/67  Pulse: 60 (!) 55  Resp: 13 15  Temp:  (!) 36.1 C  SpO2: 98% 98%    Last Pain:  Vitals:   08/03/21 1145  TempSrc: Temporal  PainSc: [place]

## 2021-08-03 NOTE — Anesthesia Preprocedure Evaluation (Signed)
Anesthesia Evaluation  Patient identified by MRN, date of birth, ID band Patient awake    Reviewed: Allergy & Precautions, H&P , NPO status , Patient's Chart, lab work & pertinent test results, reviewed documented beta blocker date and time   Airway Mallampati: III  TM Distance: >3 FB Neck ROM: full    Dental  (+) Teeth Intact, Poor Dentition   Pulmonary neg pulmonary ROS,    Pulmonary exam normal        Cardiovascular Exercise Tolerance: Poor hypertension, On Medications negative cardio ROS  Atrial Fibrillation  Rhythm:regular Rate:Normal     Neuro/Psych negative neurological ROS  negative psych ROS   GI/Hepatic Neg liver ROS, GERD  Medicated,  Endo/Other  negative endocrine ROSdiabetes, Well Controlled, Type 2, Oral Hypoglycemic Agents  Renal/GU CRFRenal disease  negative genitourinary   Musculoskeletal   Abdominal   Peds  Hematology negative hematology ROS (+)   Anesthesia Other Findings Past Medical History: No date: Aortic atherosclerosis (HCC) No date: Basal cell carcinoma of skin No date: BPH (benign prostatic hyperplasia) No date: Bradycardia No date: CKD (chronic kidney disease), stage III (HCC) No date: Current use of long term anticoagulation     Comment:  a.) Apixaban No date: Diverticulosis No date: GERD (gastroesophageal reflux disease) No date: Glaucoma No date: History of kidney stones No date: Hyperlipidemia No date: Hypertension No date: Myalgia due to statin No date: Paroxysmal atrial fibrillation (HCC)     Comment:  a.) CHA2DS2-VASc =5 (age x 2, HTN, aortic plaque, T2DM).              b.) rate/rhythm maintained on metoprolol. c.) chronically              anticoagulated using reduced dose apixaban. No date: T2DM (type 2 diabetes mellitus) ([place]) No date: Traumatic subdural hematoma 08/21/2014: Valvular regurgitation     Comment:  a.) TTE 08/21/2014: EF >55%; BAE, mod PHTN; mild AP/PR,                mod TR, severe MR. b.) TTE 08/29/2016: EF 55%; triv AR,               mild TR, moderate MR/PR. c.) TTE 08/09/2018: EF 40-45%;               BAE, mild RV enlargement; trivial AR, mild TR/PR,               moderate MR. Past Surgical History: No date: COLONOSCOPY W/ POLYPECTOMY 2007: SUBDURAL HEMATOMA EVACUATION VIA CRANIOTOMY     Comment:  trimming tree and branch fell on head No date: TONSILLECTOMY No date: VASECTOMY BMI    Body Mass Index: 24.43 kg/m     Reproductive/Obstetrics negative OB ROS                             Anesthesia Physical Anesthesia Plan  ASA: 3  Anesthesia Plan: General ETT   Post-op Pain Management:    Induction:   PONV Risk Score and Plan:   Airway Management Planned:   Additional Equipment:   Intra-op Plan:   Post-operative Plan:   Informed Consent: I have reviewed the patients History and Physical, chart, labs and discussed the procedure including the risks, benefits and alternatives for the proposed anesthesia with the patient or authorized representative who has indicated his/her understanding and acceptance.     Dental Advisory Given  Plan Discussed with: CRNA  Anesthesia Plan Comments:  Anesthesia Quick Evaluation  

## 2021-08-03 NOTE — Op Note (Signed)
Preoperative diagnosis: Left inguinal hernia.   Postoperative diagnosis: Left inguinal hernia.  Procedure: Robotic assisted Laparoscopic Transabdominal preperitoneal laparoscopic (TAPP) repair of left inguinal hernia.  Anesthesia: GETA  Surgeon: Dr. Windell Moment  Wound Classification: Clean  Indications:  Patient is a 87 y.o. male developed a symptomatic left inguinal hernia. Repair was indicated.  Findings: 1. Left Inguinal hernia identified 2. Vas deferens and cord structures identified and preserved 3. Bard 3D Merlon mesh used for repair 4. Adequate hemostasis.          Description of procedure: The patient was taken to the operating room and the correct side of surgery was verified. The patient was placed supine with arms tucked at the sides. After obtaining adequate anesthesia, the patient's abdomen was prepped and draped in standard sterile fashion. The patient was placed in the Trendelenburg position. A time-out was completed verifying correct patient, procedure, site, positioning, and implant(s) and/or special equipment prior to beginning this procedure. A Veress needle was placed at the umbilicus and pneumoperitoneum created with insufflation of carbon dioxide to 15 mmHg. After the Veress needle was removed, an 8-mm trocar was placed on epigastric area and the 30 angled laparoscope inserted. Two 8-mm trocars were then placed lateral to the rectus sheath under direct visualization. Both inguinal regions were inspected and the median umbilical ligament, medial umbilical ligament, and lateral umbilical fold were identified.  The robotic arms were docked. The robotic scope was inserted and the pelvic area anatomy targeted.  The peritoneum was incised with scissors along a line 5 cm above the superior edge of the hernia defect, extending from the median umbilical ligament to the anterior superior iliac spine. The peritoneal flap was mobilized inferiorly using blunt and sharp dissection.  The inferior epigastric vessels were exposed and the pubic symphysis was identified. Cooper's ligament was dissected to its junction with the iliac vein. The dissection was continued inferiorly to the iliopubic tract, with care taken to avoid injury to the femoral branch of the genitofemoral nerve and the lateral femoral cutaneous nerve. The cord structures were parietalized. The hernia was identified and reduced by gentle traction.  The hernia sac and lipoma were noted mobilized from the cord structures and reduced into the peritoneal cavity.  A large piece of mesh was rolled longitudinally into a compact cylinder and passed through a trocar. The cylinder was placed along the inferior aspect of the working space and unrolled into place to completely cover the direct, indirect, and femoral spaces. The mesh was secured into place superiorly to the anterior abdominal wall and inferiorly and medially to Cooper's ligament with absorbable sutures. Care was taken to avoid the inferolateral triangles containing the iliac vessels and genital nerves. The peritoneal flap was closed over the mesh and secured with suture in similar positions of safety. After ensuring adequate hemostasis, the trocars were removed and the pneumoperitoneum allowed to escape. The trocar incisions were closed using monocryl and skin adhesive dressings applied.  The patient tolerated the procedure well and was taken to the postanesthesia care unit in stable condition.   Specimen: None  Complications: None  Estimated Blood Loss: 10 mL

## 2021-08-03 NOTE — Interval H&P Note (Signed)
History and Physical Interval Note:  08/03/2021 8:09 AM  Craig [name]  has presented today for surgery, with the diagnosis of K40.90 non recurrent unilateral inguinal hernia w/o obstruction or gangrene.  The various methods of treatment have been discussed with the patient and family. After consideration of risks, benefits and other options for treatment, the patient has consented to  Procedure(s): XI ROBOTIC [place] (Left) as a surgical intervention.  The patient's history has been reviewed, patient examined, no change in status, stable for surgery.  I have reviewed the patient's chart and labs.  Questions were answered to the patient's satisfaction.     Herbert Pun

## 2021-08-04 ENCOUNTER — Encounter: Payer: Self-pay | Admitting: General Surgery

## 2021-08-04 NOTE — Addendum Note (Signed)
Addendum  created 08/04/21 1140 by Hedda Slade, CRNA   Intraprocedure Event edited

## 2021-09-21 DIAGNOSIS — E7800 Pure hypercholesterolemia, unspecified: Secondary | ICD-10-CM | POA: Diagnosis not present

## 2021-09-21 DIAGNOSIS — I10 Essential (primary) hypertension: Secondary | ICD-10-CM | POA: Diagnosis not present

## 2021-09-21 DIAGNOSIS — E1169 Type 2 diabetes mellitus with other specified complication: Secondary | ICD-10-CM | POA: Diagnosis not present

## 2021-09-21 DIAGNOSIS — E785 Hyperlipidemia, unspecified: Secondary | ICD-10-CM | POA: Diagnosis not present

## 2021-09-28 DIAGNOSIS — N1831 Chronic kidney disease, stage 3a: Secondary | ICD-10-CM | POA: Diagnosis not present

## 2021-09-28 DIAGNOSIS — I10 Essential (primary) hypertension: Secondary | ICD-10-CM | POA: Diagnosis not present

## 2021-09-28 DIAGNOSIS — E1169 Type 2 diabetes mellitus with other specified complication: Secondary | ICD-10-CM | POA: Diagnosis not present

## 2021-09-28 DIAGNOSIS — E7800 Pure hypercholesterolemia, unspecified: Secondary | ICD-10-CM | POA: Diagnosis not present

## 2021-09-28 DIAGNOSIS — Z0000 Encounter for general adult medical examination without abnormal findings: Secondary | ICD-10-CM | POA: Diagnosis not present

## 2021-09-28 DIAGNOSIS — D696 Thrombocytopenia, unspecified: Secondary | ICD-10-CM | POA: Diagnosis not present

## 2021-09-28 DIAGNOSIS — E785 Hyperlipidemia, unspecified: Secondary | ICD-10-CM | POA: Diagnosis not present

## 2021-09-28 DIAGNOSIS — H6123 Impacted cerumen, bilateral: Secondary | ICD-10-CM | POA: Diagnosis not present

## 2021-10-17 DIAGNOSIS — H9193 Unspecified hearing loss, bilateral: Secondary | ICD-10-CM | POA: Diagnosis not present

## 2021-10-17 DIAGNOSIS — H6123 Impacted cerumen, bilateral: Secondary | ICD-10-CM | POA: Diagnosis not present

## 2021-10-26 DIAGNOSIS — C44329 Squamous cell carcinoma of skin of other parts of face: Secondary | ICD-10-CM | POA: Diagnosis not present

## 2021-10-26 DIAGNOSIS — D485 Neoplasm of uncertain behavior of skin: Secondary | ICD-10-CM | POA: Diagnosis not present

## 2021-10-26 DIAGNOSIS — L570 Actinic keratosis: Secondary | ICD-10-CM | POA: Diagnosis not present

## 2021-10-26 DIAGNOSIS — C44311 Basal cell carcinoma of skin of nose: Secondary | ICD-10-CM | POA: Diagnosis not present

## 2021-10-26 DIAGNOSIS — C44319 Basal cell carcinoma of skin of other parts of face: Secondary | ICD-10-CM | POA: Diagnosis not present

## 2021-10-26 DIAGNOSIS — Z85828 Personal history of other malignant neoplasm of skin: Secondary | ICD-10-CM | POA: Diagnosis not present

## 2021-10-26 DIAGNOSIS — L821 Other seborrheic keratosis: Secondary | ICD-10-CM | POA: Diagnosis not present

## 2021-11-30 DIAGNOSIS — Z85828 Personal history of other malignant neoplasm of skin: Secondary | ICD-10-CM | POA: Diagnosis not present

## 2021-11-30 DIAGNOSIS — C4401 Basal cell carcinoma of skin of lip: Secondary | ICD-10-CM | POA: Diagnosis not present

## 2022-02-16 DIAGNOSIS — L821 Other seborrheic keratosis: Secondary | ICD-10-CM | POA: Diagnosis not present

## 2022-02-16 DIAGNOSIS — Z85828 Personal history of other malignant neoplasm of skin: Secondary | ICD-10-CM | POA: Diagnosis not present

## 2022-02-16 DIAGNOSIS — L570 Actinic keratosis: Secondary | ICD-10-CM | POA: Diagnosis not present

## 2022-02-16 DIAGNOSIS — C44311 Basal cell carcinoma of skin of nose: Secondary | ICD-10-CM | POA: Diagnosis not present

## 2022-03-15 DIAGNOSIS — D696 Thrombocytopenia, unspecified: Secondary | ICD-10-CM | POA: Diagnosis not present

## 2022-03-15 DIAGNOSIS — N1831 Chronic kidney disease, stage 3a: Secondary | ICD-10-CM | POA: Diagnosis not present

## 2022-03-15 DIAGNOSIS — E7800 Pure hypercholesterolemia, unspecified: Secondary | ICD-10-CM | POA: Diagnosis not present

## 2022-03-15 DIAGNOSIS — I10 Essential (primary) hypertension: Secondary | ICD-10-CM | POA: Diagnosis not present

## 2022-03-15 DIAGNOSIS — E785 Hyperlipidemia, unspecified: Secondary | ICD-10-CM | POA: Diagnosis not present

## 2022-03-15 DIAGNOSIS — E1169 Type 2 diabetes mellitus with other specified complication: Secondary | ICD-10-CM | POA: Diagnosis not present

## 2022-03-29 DIAGNOSIS — Z23 Encounter for immunization: Secondary | ICD-10-CM | POA: Diagnosis not present

## 2022-03-29 DIAGNOSIS — I480 Paroxysmal atrial fibrillation: Secondary | ICD-10-CM | POA: Diagnosis not present

## 2022-03-29 DIAGNOSIS — E785 Hyperlipidemia, unspecified: Secondary | ICD-10-CM | POA: Diagnosis not present

## 2022-03-29 DIAGNOSIS — E1169 Type 2 diabetes mellitus with other specified complication: Secondary | ICD-10-CM | POA: Diagnosis not present

## 2022-03-29 DIAGNOSIS — E7800 Pure hypercholesterolemia, unspecified: Secondary | ICD-10-CM | POA: Diagnosis not present

## 2022-03-29 DIAGNOSIS — D696 Thrombocytopenia, unspecified: Secondary | ICD-10-CM | POA: Diagnosis not present

## 2022-03-29 DIAGNOSIS — N1832 Chronic kidney disease, stage 3b: Secondary | ICD-10-CM | POA: Diagnosis not present

## 2022-03-29 DIAGNOSIS — Z0000 Encounter for general adult medical examination without abnormal findings: Secondary | ICD-10-CM | POA: Diagnosis not present

## 2022-03-29 DIAGNOSIS — I10 Essential (primary) hypertension: Secondary | ICD-10-CM | POA: Diagnosis not present

## 2022-05-22 DIAGNOSIS — C4442 Squamous cell carcinoma of skin of scalp and neck: Secondary | ICD-10-CM | POA: Diagnosis not present

## 2022-05-22 DIAGNOSIS — Z85828 Personal history of other malignant neoplasm of skin: Secondary | ICD-10-CM | POA: Diagnosis not present

## 2022-05-22 DIAGNOSIS — C44311 Basal cell carcinoma of skin of nose: Secondary | ICD-10-CM | POA: Diagnosis not present

## 2022-05-22 DIAGNOSIS — D485 Neoplasm of uncertain behavior of skin: Secondary | ICD-10-CM | POA: Diagnosis not present

## 2022-05-22 DIAGNOSIS — L570 Actinic keratosis: Secondary | ICD-10-CM | POA: Diagnosis not present

## 2022-05-22 DIAGNOSIS — C44319 Basal cell carcinoma of skin of other parts of face: Secondary | ICD-10-CM | POA: Diagnosis not present

## 2022-06-04 DIAGNOSIS — L299 Pruritus, unspecified: Secondary | ICD-10-CM | POA: Diagnosis not present

## 2022-06-04 DIAGNOSIS — Z8719 Personal history of other diseases of the digestive system: Secondary | ICD-10-CM | POA: Diagnosis not present

## 2022-06-13 DIAGNOSIS — I340 Nonrheumatic mitral (valve) insufficiency: Secondary | ICD-10-CM | POA: Diagnosis not present

## 2022-06-13 DIAGNOSIS — E119 Type 2 diabetes mellitus without complications: Secondary | ICD-10-CM | POA: Diagnosis not present

## 2022-06-13 DIAGNOSIS — E7800 Pure hypercholesterolemia, unspecified: Secondary | ICD-10-CM | POA: Diagnosis not present

## 2022-06-13 DIAGNOSIS — I480 Paroxysmal atrial fibrillation: Secondary | ICD-10-CM | POA: Diagnosis not present

## 2022-06-13 DIAGNOSIS — I10 Essential (primary) hypertension: Secondary | ICD-10-CM | POA: Diagnosis not present

## 2022-06-13 DIAGNOSIS — N1832 Chronic kidney disease, stage 3b: Secondary | ICD-10-CM | POA: Diagnosis not present

## 2022-08-08 DIAGNOSIS — C44319 Basal cell carcinoma of skin of other parts of face: Secondary | ICD-10-CM | POA: Diagnosis not present

## 2022-08-08 DIAGNOSIS — Z85828 Personal history of other malignant neoplasm of skin: Secondary | ICD-10-CM | POA: Diagnosis not present

## 2022-08-22 DIAGNOSIS — L821 Other seborrheic keratosis: Secondary | ICD-10-CM | POA: Diagnosis not present

## 2022-08-22 DIAGNOSIS — D485 Neoplasm of uncertain behavior of skin: Secondary | ICD-10-CM | POA: Diagnosis not present

## 2022-08-22 DIAGNOSIS — C44319 Basal cell carcinoma of skin of other parts of face: Secondary | ICD-10-CM | POA: Diagnosis not present

## 2022-08-22 DIAGNOSIS — L570 Actinic keratosis: Secondary | ICD-10-CM | POA: Diagnosis not present

## 2022-08-22 DIAGNOSIS — Z85828 Personal history of other malignant neoplasm of skin: Secondary | ICD-10-CM | POA: Diagnosis not present

## 2022-08-22 DIAGNOSIS — C44311 Basal cell carcinoma of skin of nose: Secondary | ICD-10-CM | POA: Diagnosis not present

## 2022-09-22 DIAGNOSIS — E7800 Pure hypercholesterolemia, unspecified: Secondary | ICD-10-CM | POA: Diagnosis not present

## 2022-09-22 DIAGNOSIS — E785 Hyperlipidemia, unspecified: Secondary | ICD-10-CM | POA: Diagnosis not present

## 2022-09-22 DIAGNOSIS — D696 Thrombocytopenia, unspecified: Secondary | ICD-10-CM | POA: Diagnosis not present

## 2022-09-22 DIAGNOSIS — E1169 Type 2 diabetes mellitus with other specified complication: Secondary | ICD-10-CM | POA: Diagnosis not present

## 2022-09-22 DIAGNOSIS — I10 Essential (primary) hypertension: Secondary | ICD-10-CM | POA: Diagnosis not present

## 2022-09-29 DIAGNOSIS — I10 Essential (primary) hypertension: Secondary | ICD-10-CM | POA: Diagnosis not present

## 2022-09-29 DIAGNOSIS — I480 Paroxysmal atrial fibrillation: Secondary | ICD-10-CM | POA: Diagnosis not present

## 2022-09-29 DIAGNOSIS — D696 Thrombocytopenia, unspecified: Secondary | ICD-10-CM | POA: Diagnosis not present

## 2022-09-29 DIAGNOSIS — Z0000 Encounter for general adult medical examination without abnormal findings: Secondary | ICD-10-CM | POA: Diagnosis not present

## 2022-09-29 DIAGNOSIS — E7800 Pure hypercholesterolemia, unspecified: Secondary | ICD-10-CM | POA: Diagnosis not present

## 2022-09-29 DIAGNOSIS — N1831 Chronic kidney disease, stage 3a: Secondary | ICD-10-CM | POA: Diagnosis not present

## 2022-09-29 DIAGNOSIS — E1169 Type 2 diabetes mellitus with other specified complication: Secondary | ICD-10-CM | POA: Diagnosis not present

## 2022-09-29 DIAGNOSIS — E785 Hyperlipidemia, unspecified: Secondary | ICD-10-CM | POA: Diagnosis not present

## 2022-12-18 DIAGNOSIS — L570 Actinic keratosis: Secondary | ICD-10-CM | POA: Diagnosis not present

## 2022-12-18 DIAGNOSIS — L814 Other melanin hyperpigmentation: Secondary | ICD-10-CM | POA: Diagnosis not present

## 2022-12-18 DIAGNOSIS — C4361 Malignant melanoma of right upper limb, including shoulder: Secondary | ICD-10-CM | POA: Diagnosis not present

## 2022-12-18 DIAGNOSIS — D1801 Hemangioma of skin and subcutaneous tissue: Secondary | ICD-10-CM | POA: Diagnosis not present

## 2022-12-18 DIAGNOSIS — L821 Other seborrheic keratosis: Secondary | ICD-10-CM | POA: Diagnosis not present

## 2022-12-18 DIAGNOSIS — Z85828 Personal history of other malignant neoplasm of skin: Secondary | ICD-10-CM | POA: Diagnosis not present

## 2022-12-18 DIAGNOSIS — L853 Xerosis cutis: Secondary | ICD-10-CM | POA: Diagnosis not present

## 2023-01-08 DIAGNOSIS — C4361 Malignant melanoma of right upper limb, including shoulder: Secondary | ICD-10-CM | POA: Diagnosis not present

## 2023-01-08 DIAGNOSIS — D0361 Melanoma in situ of right upper limb, including shoulder: Secondary | ICD-10-CM | POA: Diagnosis not present

## 2023-01-08 DIAGNOSIS — Z85820 Personal history of malignant melanoma of skin: Secondary | ICD-10-CM | POA: Diagnosis not present

## 2023-01-08 DIAGNOSIS — Z85828 Personal history of other malignant neoplasm of skin: Secondary | ICD-10-CM | POA: Diagnosis not present

## 2023-01-12 DIAGNOSIS — E1169 Type 2 diabetes mellitus with other specified complication: Secondary | ICD-10-CM | POA: Diagnosis not present

## 2023-01-12 DIAGNOSIS — E7800 Pure hypercholesterolemia, unspecified: Secondary | ICD-10-CM | POA: Diagnosis not present

## 2023-01-12 DIAGNOSIS — N1831 Chronic kidney disease, stage 3a: Secondary | ICD-10-CM | POA: Diagnosis not present

## 2023-01-12 DIAGNOSIS — I10 Essential (primary) hypertension: Secondary | ICD-10-CM | POA: Diagnosis not present

## 2023-01-12 DIAGNOSIS — I340 Nonrheumatic mitral (valve) insufficiency: Secondary | ICD-10-CM | POA: Diagnosis not present

## 2023-01-12 DIAGNOSIS — I480 Paroxysmal atrial fibrillation: Secondary | ICD-10-CM | POA: Diagnosis not present

## 2023-01-12 DIAGNOSIS — E785 Hyperlipidemia, unspecified: Secondary | ICD-10-CM | POA: Diagnosis not present

## 2023-02-05 DIAGNOSIS — Z85820 Personal history of malignant melanoma of skin: Secondary | ICD-10-CM | POA: Diagnosis not present

## 2023-02-05 DIAGNOSIS — Z85828 Personal history of other malignant neoplasm of skin: Secondary | ICD-10-CM | POA: Diagnosis not present

## 2023-02-05 DIAGNOSIS — L570 Actinic keratosis: Secondary | ICD-10-CM | POA: Diagnosis not present

## 2023-02-05 DIAGNOSIS — L821 Other seborrheic keratosis: Secondary | ICD-10-CM | POA: Diagnosis not present

## 2023-02-05 DIAGNOSIS — C44319 Basal cell carcinoma of skin of other parts of face: Secondary | ICD-10-CM | POA: Diagnosis not present

## 2023-03-06 DIAGNOSIS — Z85828 Personal history of other malignant neoplasm of skin: Secondary | ICD-10-CM | POA: Diagnosis not present

## 2023-03-06 DIAGNOSIS — C44729 Squamous cell carcinoma of skin of left lower limb, including hip: Secondary | ICD-10-CM | POA: Diagnosis not present

## 2023-03-06 DIAGNOSIS — D692 Other nonthrombocytopenic purpura: Secondary | ICD-10-CM | POA: Diagnosis not present

## 2023-03-06 DIAGNOSIS — Z85820 Personal history of malignant melanoma of skin: Secondary | ICD-10-CM | POA: Diagnosis not present

## 2023-03-06 DIAGNOSIS — L570 Actinic keratosis: Secondary | ICD-10-CM | POA: Diagnosis not present

## 2023-03-06 DIAGNOSIS — L821 Other seborrheic keratosis: Secondary | ICD-10-CM | POA: Diagnosis not present

## 2023-03-06 DIAGNOSIS — D485 Neoplasm of uncertain behavior of skin: Secondary | ICD-10-CM | POA: Diagnosis not present

## 2023-03-06 DIAGNOSIS — L814 Other melanin hyperpigmentation: Secondary | ICD-10-CM | POA: Diagnosis not present

## 2023-03-30 DIAGNOSIS — D696 Thrombocytopenia, unspecified: Secondary | ICD-10-CM | POA: Diagnosis not present

## 2023-03-30 DIAGNOSIS — E7800 Pure hypercholesterolemia, unspecified: Secondary | ICD-10-CM | POA: Diagnosis not present

## 2023-03-30 DIAGNOSIS — N1831 Chronic kidney disease, stage 3a: Secondary | ICD-10-CM | POA: Diagnosis not present

## 2023-03-30 DIAGNOSIS — E785 Hyperlipidemia, unspecified: Secondary | ICD-10-CM | POA: Diagnosis not present

## 2023-03-30 DIAGNOSIS — E1169 Type 2 diabetes mellitus with other specified complication: Secondary | ICD-10-CM | POA: Diagnosis not present

## 2023-04-06 DIAGNOSIS — E785 Hyperlipidemia, unspecified: Secondary | ICD-10-CM | POA: Diagnosis not present

## 2023-04-06 DIAGNOSIS — Z0000 Encounter for general adult medical examination without abnormal findings: Secondary | ICD-10-CM | POA: Diagnosis not present

## 2023-04-06 DIAGNOSIS — E7800 Pure hypercholesterolemia, unspecified: Secondary | ICD-10-CM | POA: Diagnosis not present

## 2023-04-06 DIAGNOSIS — D696 Thrombocytopenia, unspecified: Secondary | ICD-10-CM | POA: Diagnosis not present

## 2023-04-06 DIAGNOSIS — I10 Essential (primary) hypertension: Secondary | ICD-10-CM | POA: Diagnosis not present

## 2023-04-06 DIAGNOSIS — E1169 Type 2 diabetes mellitus with other specified complication: Secondary | ICD-10-CM | POA: Diagnosis not present

## 2023-04-06 DIAGNOSIS — N1831 Chronic kidney disease, stage 3a: Secondary | ICD-10-CM | POA: Diagnosis not present

## 2023-05-07 DIAGNOSIS — H6121 Impacted cerumen, right ear: Secondary | ICD-10-CM | POA: Diagnosis not present

## 2023-05-15 DIAGNOSIS — R051 Acute cough: Secondary | ICD-10-CM | POA: Diagnosis not present

## 2023-05-15 DIAGNOSIS — J019 Acute sinusitis, unspecified: Secondary | ICD-10-CM | POA: Diagnosis not present

## 2023-05-15 DIAGNOSIS — Z03818 Encounter for observation for suspected exposure to other biological agents ruled out: Secondary | ICD-10-CM | POA: Diagnosis not present

## 2023-05-21 DIAGNOSIS — E1169 Type 2 diabetes mellitus with other specified complication: Secondary | ICD-10-CM | POA: Diagnosis not present

## 2023-05-21 DIAGNOSIS — J019 Acute sinusitis, unspecified: Secondary | ICD-10-CM | POA: Diagnosis not present

## 2023-05-21 DIAGNOSIS — E785 Hyperlipidemia, unspecified: Secondary | ICD-10-CM | POA: Diagnosis not present

## 2023-05-21 DIAGNOSIS — I10 Essential (primary) hypertension: Secondary | ICD-10-CM | POA: Diagnosis not present

## 2023-06-20 DIAGNOSIS — Z85828 Personal history of other malignant neoplasm of skin: Secondary | ICD-10-CM | POA: Diagnosis not present

## 2023-06-20 DIAGNOSIS — L570 Actinic keratosis: Secondary | ICD-10-CM | POA: Diagnosis not present

## 2023-06-20 DIAGNOSIS — L578 Other skin changes due to chronic exposure to nonionizing radiation: Secondary | ICD-10-CM | POA: Diagnosis not present

## 2023-06-20 DIAGNOSIS — Z85820 Personal history of malignant melanoma of skin: Secondary | ICD-10-CM | POA: Diagnosis not present

## 2023-07-17 DIAGNOSIS — I480 Paroxysmal atrial fibrillation: Secondary | ICD-10-CM | POA: Diagnosis not present

## 2023-07-17 DIAGNOSIS — E1169 Type 2 diabetes mellitus with other specified complication: Secondary | ICD-10-CM | POA: Diagnosis not present

## 2023-07-17 DIAGNOSIS — E785 Hyperlipidemia, unspecified: Secondary | ICD-10-CM | POA: Diagnosis not present

## 2023-07-17 DIAGNOSIS — E7800 Pure hypercholesterolemia, unspecified: Secondary | ICD-10-CM | POA: Diagnosis not present

## 2023-07-17 DIAGNOSIS — I10 Essential (primary) hypertension: Secondary | ICD-10-CM | POA: Diagnosis not present

## 2023-07-17 DIAGNOSIS — I340 Nonrheumatic mitral (valve) insufficiency: Secondary | ICD-10-CM | POA: Diagnosis not present

## 2023-07-17 DIAGNOSIS — N1831 Chronic kidney disease, stage 3a: Secondary | ICD-10-CM | POA: Diagnosis not present

## 2023-08-08 DIAGNOSIS — H2513 Age-related nuclear cataract, bilateral: Secondary | ICD-10-CM | POA: Diagnosis not present

## 2023-08-08 DIAGNOSIS — E119 Type 2 diabetes mellitus without complications: Secondary | ICD-10-CM | POA: Diagnosis not present

## 2023-08-08 DIAGNOSIS — H02003 Unspecified entropion of right eye, unspecified eyelid: Secondary | ICD-10-CM | POA: Diagnosis not present

## 2023-08-08 DIAGNOSIS — H538 Other visual disturbances: Secondary | ICD-10-CM | POA: Diagnosis not present

## 2023-08-08 DIAGNOSIS — H02006 Unspecified entropion of left eye, unspecified eyelid: Secondary | ICD-10-CM | POA: Diagnosis not present

## 2023-09-20 DIAGNOSIS — Z85820 Personal history of malignant melanoma of skin: Secondary | ICD-10-CM | POA: Diagnosis not present

## 2023-09-20 DIAGNOSIS — Z85828 Personal history of other malignant neoplasm of skin: Secondary | ICD-10-CM | POA: Diagnosis not present

## 2023-09-20 DIAGNOSIS — D485 Neoplasm of uncertain behavior of skin: Secondary | ICD-10-CM | POA: Diagnosis not present

## 2023-09-20 DIAGNOSIS — L821 Other seborrheic keratosis: Secondary | ICD-10-CM | POA: Diagnosis not present

## 2023-09-20 DIAGNOSIS — D1801 Hemangioma of skin and subcutaneous tissue: Secondary | ICD-10-CM | POA: Diagnosis not present

## 2023-09-20 DIAGNOSIS — C44319 Basal cell carcinoma of skin of other parts of face: Secondary | ICD-10-CM | POA: Diagnosis not present

## 2023-09-20 DIAGNOSIS — L578 Other skin changes due to chronic exposure to nonionizing radiation: Secondary | ICD-10-CM | POA: Diagnosis not present

## 2023-09-20 DIAGNOSIS — L570 Actinic keratosis: Secondary | ICD-10-CM | POA: Diagnosis not present

## 2023-10-04 DIAGNOSIS — D696 Thrombocytopenia, unspecified: Secondary | ICD-10-CM | POA: Diagnosis not present

## 2023-10-04 DIAGNOSIS — E7800 Pure hypercholesterolemia, unspecified: Secondary | ICD-10-CM | POA: Diagnosis not present

## 2023-10-04 DIAGNOSIS — E785 Hyperlipidemia, unspecified: Secondary | ICD-10-CM | POA: Diagnosis not present

## 2023-10-04 DIAGNOSIS — E1169 Type 2 diabetes mellitus with other specified complication: Secondary | ICD-10-CM | POA: Diagnosis not present

## 2023-10-04 DIAGNOSIS — I10 Essential (primary) hypertension: Secondary | ICD-10-CM | POA: Diagnosis not present

## 2023-10-10 DIAGNOSIS — D696 Thrombocytopenia, unspecified: Secondary | ICD-10-CM | POA: Diagnosis not present

## 2023-10-10 DIAGNOSIS — E7800 Pure hypercholesterolemia, unspecified: Secondary | ICD-10-CM | POA: Diagnosis not present

## 2023-10-10 DIAGNOSIS — E785 Hyperlipidemia, unspecified: Secondary | ICD-10-CM | POA: Diagnosis not present

## 2023-10-10 DIAGNOSIS — I480 Paroxysmal atrial fibrillation: Secondary | ICD-10-CM | POA: Diagnosis not present

## 2023-10-10 DIAGNOSIS — E1169 Type 2 diabetes mellitus with other specified complication: Secondary | ICD-10-CM | POA: Diagnosis not present

## 2023-10-10 DIAGNOSIS — R600 Localized edema: Secondary | ICD-10-CM | POA: Diagnosis not present

## 2023-10-10 DIAGNOSIS — I10 Essential (primary) hypertension: Secondary | ICD-10-CM | POA: Diagnosis not present

## 2023-10-10 DIAGNOSIS — Z0000 Encounter for general adult medical examination without abnormal findings: Secondary | ICD-10-CM | POA: Diagnosis not present

## 2023-10-10 DIAGNOSIS — N1831 Chronic kidney disease, stage 3a: Secondary | ICD-10-CM | POA: Diagnosis not present

## 2023-11-05 DIAGNOSIS — H02006 Unspecified entropion of left eye, unspecified eyelid: Secondary | ICD-10-CM | POA: Diagnosis not present

## 2023-11-05 DIAGNOSIS — H40051 Ocular hypertension, right eye: Secondary | ICD-10-CM | POA: Diagnosis not present

## 2023-11-05 DIAGNOSIS — H02003 Unspecified entropion of right eye, unspecified eyelid: Secondary | ICD-10-CM | POA: Diagnosis not present

## 2023-11-12 DIAGNOSIS — Z85820 Personal history of malignant melanoma of skin: Secondary | ICD-10-CM | POA: Diagnosis not present

## 2023-11-12 DIAGNOSIS — C44319 Basal cell carcinoma of skin of other parts of face: Secondary | ICD-10-CM | POA: Diagnosis not present

## 2023-11-12 DIAGNOSIS — Z85828 Personal history of other malignant neoplasm of skin: Secondary | ICD-10-CM | POA: Diagnosis not present

## 2024-01-01 DIAGNOSIS — I340 Nonrheumatic mitral (valve) insufficiency: Secondary | ICD-10-CM | POA: Diagnosis not present

## 2024-01-01 DIAGNOSIS — E7800 Pure hypercholesterolemia, unspecified: Secondary | ICD-10-CM | POA: Diagnosis not present

## 2024-01-01 DIAGNOSIS — I10 Essential (primary) hypertension: Secondary | ICD-10-CM | POA: Diagnosis not present

## 2024-01-01 DIAGNOSIS — I480 Paroxysmal atrial fibrillation: Secondary | ICD-10-CM | POA: Diagnosis not present

## 2024-01-02 DIAGNOSIS — L821 Other seborrheic keratosis: Secondary | ICD-10-CM | POA: Diagnosis not present

## 2024-01-02 DIAGNOSIS — L570 Actinic keratosis: Secondary | ICD-10-CM | POA: Diagnosis not present

## 2024-01-02 DIAGNOSIS — Z85828 Personal history of other malignant neoplasm of skin: Secondary | ICD-10-CM | POA: Diagnosis not present

## 2024-01-02 DIAGNOSIS — Z85820 Personal history of malignant melanoma of skin: Secondary | ICD-10-CM | POA: Diagnosis not present

## 2024-01-10 DIAGNOSIS — I340 Nonrheumatic mitral (valve) insufficiency: Secondary | ICD-10-CM | POA: Diagnosis not present

## 2024-01-10 DIAGNOSIS — I10 Essential (primary) hypertension: Secondary | ICD-10-CM | POA: Diagnosis not present

## 2024-01-10 DIAGNOSIS — I480 Paroxysmal atrial fibrillation: Secondary | ICD-10-CM | POA: Diagnosis not present

## 2024-01-10 DIAGNOSIS — E785 Hyperlipidemia, unspecified: Secondary | ICD-10-CM | POA: Diagnosis not present

## 2024-01-10 DIAGNOSIS — N1831 Chronic kidney disease, stage 3a: Secondary | ICD-10-CM | POA: Diagnosis not present

## 2024-01-10 DIAGNOSIS — E1169 Type 2 diabetes mellitus with other specified complication: Secondary | ICD-10-CM | POA: Diagnosis not present

## 2024-01-15 DIAGNOSIS — I480 Paroxysmal atrial fibrillation: Secondary | ICD-10-CM | POA: Diagnosis not present

## 2024-01-15 DIAGNOSIS — I340 Nonrheumatic mitral (valve) insufficiency: Secondary | ICD-10-CM | POA: Diagnosis not present

## 2024-01-22 DIAGNOSIS — R600 Localized edema: Secondary | ICD-10-CM | POA: Diagnosis not present

## 2024-01-22 DIAGNOSIS — I480 Paroxysmal atrial fibrillation: Secondary | ICD-10-CM | POA: Diagnosis not present

## 2024-01-22 DIAGNOSIS — I83019 Varicose veins of right lower extremity with ulcer of unspecified site: Secondary | ICD-10-CM | POA: Diagnosis not present

## 2024-01-22 DIAGNOSIS — E1169 Type 2 diabetes mellitus with other specified complication: Secondary | ICD-10-CM | POA: Diagnosis not present

## 2024-01-22 DIAGNOSIS — D696 Thrombocytopenia, unspecified: Secondary | ICD-10-CM | POA: Diagnosis not present

## 2024-01-22 DIAGNOSIS — I10 Essential (primary) hypertension: Secondary | ICD-10-CM | POA: Diagnosis not present

## 2024-01-22 DIAGNOSIS — N1831 Chronic kidney disease, stage 3a: Secondary | ICD-10-CM | POA: Diagnosis not present

## 2024-01-22 DIAGNOSIS — L97919 Non-pressure chronic ulcer of unspecified part of right lower leg with unspecified severity: Secondary | ICD-10-CM | POA: Diagnosis not present

## 2024-01-22 DIAGNOSIS — E785 Hyperlipidemia, unspecified: Secondary | ICD-10-CM | POA: Diagnosis not present

## 2024-01-22 DIAGNOSIS — I071 Rheumatic tricuspid insufficiency: Secondary | ICD-10-CM | POA: Diagnosis not present

## 2024-01-24 DIAGNOSIS — I83019 Varicose veins of right lower extremity with ulcer of unspecified site: Secondary | ICD-10-CM | POA: Diagnosis not present

## 2024-01-24 DIAGNOSIS — E785 Hyperlipidemia, unspecified: Secondary | ICD-10-CM | POA: Diagnosis not present

## 2024-01-24 DIAGNOSIS — E1169 Type 2 diabetes mellitus with other specified complication: Secondary | ICD-10-CM | POA: Diagnosis not present

## 2024-01-24 DIAGNOSIS — L97919 Non-pressure chronic ulcer of unspecified part of right lower leg with unspecified severity: Secondary | ICD-10-CM | POA: Diagnosis not present

## 2024-01-29 DIAGNOSIS — N1831 Chronic kidney disease, stage 3a: Secondary | ICD-10-CM | POA: Diagnosis not present

## 2024-01-29 DIAGNOSIS — E785 Hyperlipidemia, unspecified: Secondary | ICD-10-CM | POA: Diagnosis not present

## 2024-01-29 DIAGNOSIS — I83019 Varicose veins of right lower extremity with ulcer of unspecified site: Secondary | ICD-10-CM | POA: Diagnosis not present

## 2024-01-29 DIAGNOSIS — E1169 Type 2 diabetes mellitus with other specified complication: Secondary | ICD-10-CM | POA: Diagnosis not present

## 2024-01-29 DIAGNOSIS — L97919 Non-pressure chronic ulcer of unspecified part of right lower leg with unspecified severity: Secondary | ICD-10-CM | POA: Diagnosis not present

## 2024-01-29 DIAGNOSIS — Z79899 Other long term (current) drug therapy: Secondary | ICD-10-CM | POA: Diagnosis not present

## 2024-02-05 DIAGNOSIS — E1169 Type 2 diabetes mellitus with other specified complication: Secondary | ICD-10-CM | POA: Diagnosis not present

## 2024-02-05 DIAGNOSIS — I83019 Varicose veins of right lower extremity with ulcer of unspecified site: Secondary | ICD-10-CM | POA: Diagnosis not present

## 2024-02-05 DIAGNOSIS — L97919 Non-pressure chronic ulcer of unspecified part of right lower leg with unspecified severity: Secondary | ICD-10-CM | POA: Diagnosis not present

## 2024-02-05 DIAGNOSIS — E785 Hyperlipidemia, unspecified: Secondary | ICD-10-CM | POA: Diagnosis not present

## 2024-02-06 DIAGNOSIS — I480 Paroxysmal atrial fibrillation: Secondary | ICD-10-CM | POA: Diagnosis not present

## 2024-02-06 DIAGNOSIS — I10 Essential (primary) hypertension: Secondary | ICD-10-CM | POA: Diagnosis not present

## 2024-02-06 DIAGNOSIS — R600 Localized edema: Secondary | ICD-10-CM | POA: Diagnosis not present

## 2024-02-06 DIAGNOSIS — I071 Rheumatic tricuspid insufficiency: Secondary | ICD-10-CM | POA: Diagnosis not present

## 2024-04-09 DIAGNOSIS — D696 Thrombocytopenia, unspecified: Secondary | ICD-10-CM | POA: Diagnosis not present

## 2024-04-09 DIAGNOSIS — E1169 Type 2 diabetes mellitus with other specified complication: Secondary | ICD-10-CM | POA: Diagnosis not present

## 2024-04-09 DIAGNOSIS — E785 Hyperlipidemia, unspecified: Secondary | ICD-10-CM | POA: Diagnosis not present

## 2024-04-09 DIAGNOSIS — E7800 Pure hypercholesterolemia, unspecified: Secondary | ICD-10-CM | POA: Diagnosis not present

## 2024-04-10 DIAGNOSIS — Z85828 Personal history of other malignant neoplasm of skin: Secondary | ICD-10-CM | POA: Diagnosis not present

## 2024-04-10 DIAGNOSIS — L821 Other seborrheic keratosis: Secondary | ICD-10-CM | POA: Diagnosis not present

## 2024-04-10 DIAGNOSIS — D1801 Hemangioma of skin and subcutaneous tissue: Secondary | ICD-10-CM | POA: Diagnosis not present

## 2024-04-10 DIAGNOSIS — Z85820 Personal history of malignant melanoma of skin: Secondary | ICD-10-CM | POA: Diagnosis not present

## 2024-04-10 DIAGNOSIS — L570 Actinic keratosis: Secondary | ICD-10-CM | POA: Diagnosis not present

## 2024-04-16 DIAGNOSIS — I10 Essential (primary) hypertension: Secondary | ICD-10-CM | POA: Diagnosis not present

## 2024-04-16 DIAGNOSIS — N1831 Chronic kidney disease, stage 3a: Secondary | ICD-10-CM | POA: Diagnosis not present

## 2024-04-16 DIAGNOSIS — I480 Paroxysmal atrial fibrillation: Secondary | ICD-10-CM | POA: Diagnosis not present

## 2024-04-16 DIAGNOSIS — Z0000 Encounter for general adult medical examination without abnormal findings: Secondary | ICD-10-CM | POA: Diagnosis not present

## 2024-04-16 DIAGNOSIS — I071 Rheumatic tricuspid insufficiency: Secondary | ICD-10-CM | POA: Diagnosis not present

## 2024-04-16 DIAGNOSIS — E1169 Type 2 diabetes mellitus with other specified complication: Secondary | ICD-10-CM | POA: Diagnosis not present

## 2024-04-16 DIAGNOSIS — E785 Hyperlipidemia, unspecified: Secondary | ICD-10-CM | POA: Diagnosis not present

## 2024-04-16 DIAGNOSIS — E7800 Pure hypercholesterolemia, unspecified: Secondary | ICD-10-CM | POA: Diagnosis not present

## 2024-04-16 DIAGNOSIS — R600 Localized edema: Secondary | ICD-10-CM | POA: Diagnosis not present

## 2024-04-16 DIAGNOSIS — D696 Thrombocytopenia, unspecified: Secondary | ICD-10-CM | POA: Diagnosis not present

## 2024-05-14 DIAGNOSIS — H02111 Cicatricial ectropion of right upper eyelid: Secondary | ICD-10-CM | POA: Diagnosis not present

## 2024-05-14 DIAGNOSIS — E119 Type 2 diabetes mellitus without complications: Secondary | ICD-10-CM | POA: Diagnosis not present

## 2024-05-14 DIAGNOSIS — H40051 Ocular hypertension, right eye: Secondary | ICD-10-CM | POA: Diagnosis not present

## 2024-05-14 DIAGNOSIS — H2513 Age-related nuclear cataract, bilateral: Secondary | ICD-10-CM | POA: Diagnosis not present

## 2024-05-14 DIAGNOSIS — H35363 Drusen (degenerative) of macula, bilateral: Secondary | ICD-10-CM | POA: Diagnosis not present

## 2024-07-21 DIAGNOSIS — D045 Carcinoma in situ of skin of trunk: Secondary | ICD-10-CM | POA: Diagnosis not present

## 2024-07-21 DIAGNOSIS — C44319 Basal cell carcinoma of skin of other parts of face: Secondary | ICD-10-CM | POA: Diagnosis not present

## 2024-07-21 DIAGNOSIS — C44619 Basal cell carcinoma of skin of left upper limb, including shoulder: Secondary | ICD-10-CM | POA: Diagnosis not present

## 2024-07-21 DIAGNOSIS — D485 Neoplasm of uncertain behavior of skin: Secondary | ICD-10-CM | POA: Diagnosis not present

## 2024-07-21 DIAGNOSIS — Z85828 Personal history of other malignant neoplasm of skin: Secondary | ICD-10-CM | POA: Diagnosis not present

## 2024-07-21 DIAGNOSIS — Z85820 Personal history of malignant melanoma of skin: Secondary | ICD-10-CM | POA: Diagnosis not present

## 2024-07-21 DIAGNOSIS — L570 Actinic keratosis: Secondary | ICD-10-CM | POA: Diagnosis not present

## 2024-07-21 DIAGNOSIS — D487 Neoplasm of uncertain behavior of other specified sites: Secondary | ICD-10-CM | POA: Diagnosis not present

## 2024-07-24 DIAGNOSIS — E7800 Pure hypercholesterolemia, unspecified: Secondary | ICD-10-CM | POA: Diagnosis not present

## 2024-07-24 DIAGNOSIS — I480 Paroxysmal atrial fibrillation: Secondary | ICD-10-CM | POA: Diagnosis not present

## 2024-07-24 DIAGNOSIS — R600 Localized edema: Secondary | ICD-10-CM | POA: Diagnosis not present

## 2024-07-24 DIAGNOSIS — I071 Rheumatic tricuspid insufficiency: Secondary | ICD-10-CM | POA: Diagnosis not present

## 2024-07-24 DIAGNOSIS — N1831 Chronic kidney disease, stage 3a: Secondary | ICD-10-CM | POA: Diagnosis not present

## 2024-07-24 DIAGNOSIS — I10 Essential (primary) hypertension: Secondary | ICD-10-CM | POA: Diagnosis not present

## 2024-07-24 NOTE — Progress Notes (Signed)
 Established Patient Visit   Chief Complaint: Chief Complaint  Patient presents with  . Hypertension  . Atrial Fibrillation    FU Echo   Date of Service: 07/24/2024 Date of Birth: 1932/07/31 PCP: Alla Amis, MD  History of Present Illness: Mr. Craig [name] is a 89 y.o.male patient who returns for   1.  Paroxysmal atrial fibrillation  2.  Moderate to severe mitral regurgitation  3.  Mildly reduced left ventricular function  4.  Essential hypertension  5.  Hyperlipidemia  6.  Type 2 diabetes  7.  Statin intolerance  8.  Chronic kidney disease stage IIIb 9.  Thrombocytopenia  Patient was previously followed by Dr. Florencio and Kerry Pizza NP.  2D echocardiogram 08/19/2018 revealed LVEF 40-45% with moderate to severe mitral regurgitation.  Patient did experience worsening peripheral edema, started on furosemide 20 mg daily, referred to podiatry for Unna boots, with overall clinical improvement.  Furosemide was reduced to 10 mg daily due to improvement and frequent urination.  History of Present Illness Craig [name] is a 89 year old male who presents 3 mont follow-up. He reports that about 1.5 weeks ago, he noticed increased swelling in his lower extremities with tightness in his knees. He increased the dose of Lasix to 20 mg and noticed a significant improvement and reports that the swelling is resolved/back to baseline. He had no associated chest pain, increased shortness of breath, or orthopnea. His weight is up 6 pounds since the last visit. He denies feeling fluid overload, and attributes the weight gain to not being as physically active with his usual exercises. He typically does a bike and treadmill.  He denies palpitations or syncope.  The patient has paroxysmal atrial fibrillation, CHA2DS2-VASc score of 3, on low-dose Eliquis 2.5 mg twice daily.  Patient was unable to tolerate 5 mg twice daily due to excessive bleeding gums and nosebleeds.  The patient has essential hypertension,  blood pressure adequately controlled today, currently on metoprolol succinate, furosemide and lisinopril, which are well-tolerated without apparent side effects.    The patient has hyperlipidemia, LDL was 45 on 04/09/2024, on rosuvastatin, which is well-tolerated without apparent side effects, followed by his primary care provider.    The patient has type 2 diabetes, on metformin, which is well-tolerated without apparent side effects, followed by his primary care provider.    Past Medical and Surgical History  Past Medical History Past Medical History:  Diagnosis Date  . BPH (benign prostatic hyperplasia)   . Diabetes mellitus (CMS/HHS-HCC)    dx 2008  . Diverticulitis   . Essential hypertension   . H/O adenomatous polyp of colon   . History of diverticulosis   . History of nephrolithiasis   . Mitral regurgitation   . Myalgia    statin induced  . Pure hypercholesterolemia   . Traumatic subdural hematoma (CMS/HHS-HCC)     Past Surgical History He has a past surgical history that includes Evacuation right-sided subdural hematoma 10/13/2003 ([facility]); Colon polypectomy x 6 07/2006; Vasectomy; Right face basal cell carcinoma removal 12/09; Tonsillectomy; Colonoscopy (11/30/2009); and Inguinal hernia repair (Left, 08/03/2021).   Medications and Allergies  Current Medications  Current Outpatient Medications  Medication Sig Dispense Refill  . apixaban (ELIQUIS) 2.5 mg tablet Take 1 tablet (2.5 mg total) by mouth 2 (two) times daily 100 tablet 3  . blood glucose diagnostic test strip Use 1 strip once a week Use as instructed.      . carboxymethylcellulose (REFRESH PLUS) 0.5 % ophthalmic solution as needed    .  FUROsemide (LASIX) 20 MG tablet Take 1 tablet (20 mg total) by mouth once daily 30 tablet 6  . lisinopriL (ZESTRIL) 30 MG tablet Take 1 tablet (30 mg total) by mouth once daily 100 tablet 1  . metFORMIN (GLUCOPHAGE) 500 MG tablet Take 1 tablet by mouth once daily with breakfast  100 tablet 1  . metoprolol SUCCinate (TOPROL-XL) 25 MG XL tablet Take 0.5 tablets (12.5 mg total) by mouth once daily    . mineral oil/petrolatum,white (WHITE PETROLATUM-MINERAL OIL OPHTH) as needed    . rosuvastatin (CRESTOR) 5 MG tablet Take 2.5 mg by mouth as directed. Three times a week (takes Monday, Wednesday, Friday)    . terazosin (HYTRIN) 10 MG capsule Take 1 capsule (10 mg total) by mouth at bedtime 100 capsule 1  . timolol maleate (TIMOPTIC) 0.5 % topical drops INSTILL ONE DROP IN RIGHT EYE ONCE EVERY DAY     No current facility-administered medications for this visit.    Allergies: Niacin and Statins-hmg-coa reductase inhibitors  Social and Family History  Social History  reports that he has never smoked. He has never been exposed to tobacco smoke. He has never used smokeless tobacco. He reports current alcohol use. He reports that he does not use drugs.  Family History No family history on file.  Review of Systems   Review of Systems: The patient denies chest pain, with chronic shortness of breath, without orthopnea, paroxysmal nocturnal dyspnea, with mild pedal edema, without palpitations, heart racing, presyncope, syncope. Review of 8 Systems is negative except as described above.  Physical Examination   Vitals:Pulse 74   Ht 170.2 cm (5' 7)   Wt 68.9 kg (152 lb)   SpO2 98%   BMI 23.81 kg/m  Ht:170.2 cm (5' 7) Wt:68.9 kg (152 lb) ADJ:Anib surface area is 1.8 meters squared. Body mass index is 23.81 kg/m.  General: Alert and oriented. No acute distress. HEENT: Pupils equally reactive to light and accomodation    Neck: no JVD Lungs: Normal effort of breathing; clear to auscultation bilaterally; no wheezes, rales, rhonchi Heart: Regular rate and rhythm. 2-3/6 holosystolic murmur Abdomen: nondistended Extremities: no cyanosis, clubbing, with minimal bilateral pretibial edema Peripheral Pulses: 2+ radial  Skin: Warm, dry, no diaphoresis   Assessment   89 y.o.  male with  1. Essential hypertension   2. PAF (paroxysmal atrial fibrillation) on Eliquis - followed by Dr. Ammon   3. Severe tricuspid regurgitation   4. Pure hypercholesterolemia (LDL 45 - 04/09/24)   5. Peripheral edema   6. Stage 3a chronic kidney disease (CMS-HCC)    89 year old gentleman with paroxysmal atrial fibrillation, on low-dose Eliquis for stroke prevention and metoprolol succinate for rate and rhythm control, denies palpitations.  Patient has moderate mitral regurgitation and severe tricuspid regurgitation with mildly reduced left ventricular function with LVEF 45%. He had a recent 3 week history of increased exertional dyspnea, weeping peripheral edema, and weight gain, improved after starting furosemide, and Unna boots for 3 weeks. He then reduced furosemide to 10 mg daily without recurrence of significant edema until recently, at which time he increased the dose to 20 mg again with resolution. He had no other cardiac symptoms at that time. Patient has essential hypertension, blood pressure well-controlled on current BP medications.    Plan   1.  Continue current medications 2.  Continue low-sodium diet 3.  Continue rosuvastatin for hyperlipidemia management 4.  Continue furosemide 10 mg daily, increasing to 20 mg if needed for worsening swelling 5.  Return to clinic for follow-up in 4 months  No orders of the defined types were placed in this encounter.   Return in about 4 months (around 11/24/2024).  Attestation Statement:   I personally performed the service, non-incident to. (WP)   ANNA MARIA DRANE, PA-C

## 2024-07-25 DIAGNOSIS — S0180XA Unspecified open wound of other part of head, initial encounter: Secondary | ICD-10-CM | POA: Diagnosis not present

## 2024-07-25 DIAGNOSIS — Z7901 Long term (current) use of anticoagulants: Secondary | ICD-10-CM | POA: Diagnosis not present

## 2024-07-25 NOTE — Progress Notes (Signed)
  Subjective:    Chief Complaint  Patient presents with  . Facial Laceration     X 10/22 Had 6 biopsies taken, 1 on L side of chin hasn't stopped bleeding Was given silver nitrate to stop it, but still bleeding  Ok to start per Dr Delaine    History was provided by the patient and EMR. Craig [name] is a 89 y.o., male  who presents with for persistent bleeding from biopsy site from 2 days prior.  Patient seen in dermatology 2 days ago, had 6 biopsies performed about his head and neck.  Patient is on Eliquis.  Other wounds have scabbed, left lower mandible continues to bleed.  Had cauterized, without success.  No complaints of headache or dizziness; no chest pain or shortness of breath; no nausea, vomiting, diarrhea; no fevers or chills. Symptoms include: above   Patient denies: above Treatment to date: above   Past Medical History:  Diagnosis Date  . BPH (benign prostatic hyperplasia)   . Diabetes mellitus (CMS/HHS-HCC)    dx 2008  . Diverticulitis   . Essential hypertension   . H/O adenomatous polyp of colon   . History of diverticulosis   . History of nephrolithiasis   . Mitral regurgitation   . Myalgia    statin induced  . Pure hypercholesterolemia   . Traumatic subdural hematoma (CMS-HCC)    The following portions of the patient's history were reviewed and updated as appropriate: allergies, current medications, past social history, and problem list.  Review of Systems A complete review of systems was performed.  Positive and pertinent negative responses are documented in the HPI, and all other systems are negative.   Objective:     Vitals:   07/25/24 1503  BP: (!) 133/90  Pulse: 77  Temp: 36.1 C (97 F)  TempSrc: Oral  SpO2: 100%  Weight: 68.9 kg (152 lb)  Height: 172.7 cm (5' 8)  PainSc: 0-No pain    General Appearance:  Well-developed, well-nourished.  Pleasant and cooperative.  No acute distress.  Somewhat hard of hearing. HEENT:  [place]/AT, PERRLA, EOMI,  sclera clear, conjunctivae pink.  Left lower mandible with open wound, continues to ooze. Mouth and throat: Posterior oropharynx clear; tongue midline. TM's: clear bilaterally.  Neck:  Supple, no JVD or adenopathy. Cardiovascular: Irregular. Lungs:  Clear to auscultation.  Abdomen: Benign. Extremities:   No cyanosis, clubbing, or edema. Neuro:  Alert and orient x4; nonfocal.    Lab/X-ray/Treatments:   Surgicel dressing-applied.       Assessment:      1. Open wound of face, initial encounter -     cellulose, oxidized (SURGICEL) 0.5 X 2 pad 1 each  2. Anticoagulated by anticoagulation treatment -     cellulose, oxidized (SURGICEL) 0.5 X 2 pad 1 each   Surgicel dressing applied.  Leave in place for 2 to 3 days.  Then may bathe cautiously thereafter.  Call return if any further problems or concerns.    Plan:   Requested Prescriptions    No prescriptions requested or ordered in this encounter

## 2024-07-26 ENCOUNTER — Other Ambulatory Visit: Payer: Self-pay

## 2024-07-26 ENCOUNTER — Emergency Department
Admission: EM | Admit: 2024-07-26 | Discharge: 2024-07-26 | Disposition: A | Discharge status: Home / Self Care | Attending: Emergency Medicine | Admitting: Emergency Medicine

## 2024-07-26 DIAGNOSIS — S0181XA Laceration without foreign body of other part of head, initial encounter: Secondary | ICD-10-CM | POA: Diagnosis not present

## 2024-07-26 DIAGNOSIS — L7622 Postprocedural hemorrhage and hematoma of skin and subcutaneous tissue following other procedure: Secondary | ICD-10-CM | POA: Insufficient documentation

## 2024-07-26 DIAGNOSIS — Z7901 Long term (current) use of anticoagulants: Secondary | ICD-10-CM | POA: Diagnosis not present

## 2024-07-26 DIAGNOSIS — T148XXA Other injury of unspecified body region, initial encounter: Secondary | ICD-10-CM

## 2024-07-26 MED ORDER — LIDOCAINE-EPINEPHRINE 2 %-1:100000 IJ SOLN
20.0000 mL | Freq: Once | INTRAMUSCULAR | Status: AC
  Administered 2024-07-26: 20 mL
  Filled 2024-07-26: qty 1

## 2024-07-26 NOTE — ED Provider Notes (Signed)
 [facility] Provider Note    Event Date/Time   First MD Initiated Contact with Patient 07/26/24 [phone]     (approximate)   History   Wound Check   HPI  Craig [name] is a 89 y.o. male on eliquis who presents with persistent bleeding from a biopsy wound to the left side of his face from 3 days ago.  The patient had multiple biopsies but this was the only place that is still bleeding.  He was seen at the walk-in clinic yesterday.  He had been given silver nitrate, and then Surgicel was applied yesterday.  However the dressing came off and the patient had persistent bleeding during the night.  He denies any other abnormal bleeding or bruising.  Reviewed the past medical records including the chart from his visit to the walk-in clinic yesterday and the application of Surgicel.  I am unable to view the dermatology visit.   Physical Exam   Triage Vital Signs: ED Triage Vitals  Encounter Vitals Group     BP 07/26/24 0839 (!) 148/109     Girls Systolic BP Percentile --      Girls Diastolic BP Percentile --      Boys Systolic BP Percentile --      Boys Diastolic BP Percentile --      Pulse Rate 07/26/24 0839 87     Resp 07/26/24 0839 17     Temp --      Temp src --      SpO2 07/26/24 0839 100 %     Weight 07/26/24 0836 153 lb (69.4 kg)     Height 07/26/24 0836 5' 8 (1.727 m)     Head Circumference --      Peak Flow --      Pain Score 07/26/24 0835 4     Pain Loc --      Pain Education --      Exclude from Growth Chart --     Most recent vital signs: Vitals:   07/26/24 0900 07/26/24 0930  BP: 138/83 (!) 138/94  Pulse:  81  Resp:  16  SpO2:  96%     General: Awake, no distress.  CV:  Good peripheral perfusion.  Resp:  Normal effort.  Abd:  No distention.  Other:  Left face, mandibular area with approximately 1cm superficial wound with small amount of blood.  R face mandibular area with approximately 5 mm superficial wound with small amount of  blood.   ED Results / Procedures / Treatments   Labs (all labs ordered are listed, but only abnormal results are displayed) Labs Reviewed - No data to display   EKG    RADIOLOGY    PROCEDURES:  Critical Care performed: No  .Laceration Repair  Date/Time: 07/26/2024 11:02 AM  Performed by: Jacolyn Pae, MD Authorized by: Jacolyn Pae, MD   Consent:    Consent obtained:  Verbal   Risks discussed:  Infection, need for additional repair, poor cosmetic result, vascular damage and pain   Alternatives discussed:  No treatment Universal protocol:    Patient identity confirmed:  Verbally with patient Anesthesia:    Anesthesia method:  Local infiltration   Local anesthetic:  Lidocaine  1% WITH epi Laceration details:    Location:  Face   Length (cm):  1 Treatment:    Area cleansed with:  Povidone-iodine Skin repair:    Repair method:  Sutures   Suture size:  4-0   Suture material:  Nylon   Suture technique: Purse string.   Number of sutures:  1 Approximation:    Approximation:  Close Repair type:    Repair type:  Simple Post-procedure details:    Dressing:  Sterile dressing   Procedure completion:  Tolerated well, no immediate complications    MEDICATIONS ORDERED IN ED: Medications  lidocaine -EPINEPHrine  (XYLOCAINE  W/EPI) 2 %-1:100000 (with pres) injection 20 mL (20 mLs Other Given 07/26/24 0858)     IMPRESSION / MDM / ASSESSMENT AND PLAN / ED COURSE  I reviewed the triage vital signs and the nursing notes.  Differential diagnosis includes, but is not limited to, persistent bleeding from the biopsy wound.  The wound is partly scabbed over and the rate of bleeding is very slow.  Patient's presentation is most consistent with acute, uncomplicated illness.  ----------------------------------------- 9:34 AM on 07/26/2024 -----------------------------------------  For the wound on the left mandible area I injected lidocaine  with epinephrine .   Subsequently I performed a pursestring suture.  The bleeding has completely stopped and a dressing was placed.  For the right mandibular area I injected lidocaine  with epinephrine  and then placed a piece of Surgicel and a dressing.  The bleeding is controlled.  The patient is stable for discharge home.  He will follow-up either here or with a dermatologist in 3 to 5 days to have the pursestring suture removed.  Return precautions given, and he expressed understanding.   FINAL CLINICAL IMPRESSION(S) / ED DIAGNOSES   Final diagnoses:  Bleeding from wound     Rx / DC Orders   ED Discharge Orders     None        Note:  This document was prepared using Dragon voice recognition software and may include unintentional dictation errors.    Jacolyn Pae, MD 07/26/24 1115

## 2024-07-26 NOTE — ED Triage Notes (Signed)
 Pt reports has wound to left chin that doctor cut open, has been unable to stop bleeding. Reports went through 6 t shirts last night d/t bleeding. +blood thinners

## 2024-07-26 NOTE — Discharge Instructions (Signed)
 Either return here or follow-up with your dermatologist in 3 to 5 days to have the single stitch removed on the left side of your face.  Return immediately for new, worsening, or persistent severe bleeding or any other new or worsening symptoms that concern you.

## 2024-07-30 DIAGNOSIS — S0181XD Laceration without foreign body of other part of head, subsequent encounter: Secondary | ICD-10-CM | POA: Diagnosis not present

## 2024-07-30 DIAGNOSIS — Z7901 Long term (current) use of anticoagulants: Secondary | ICD-10-CM | POA: Diagnosis not present

## 2024-08-10 ENCOUNTER — Emergency Department
Admission: EM | Admit: 2024-08-10 | Discharge: 2024-08-10 | Disposition: A | Discharge status: Home / Self Care | Attending: Emergency Medicine | Admitting: Emergency Medicine

## 2024-08-10 ENCOUNTER — Other Ambulatory Visit: Payer: Self-pay

## 2024-08-10 DIAGNOSIS — Z7901 Long term (current) use of anticoagulants: Secondary | ICD-10-CM | POA: Diagnosis not present

## 2024-08-10 DIAGNOSIS — I10 Essential (primary) hypertension: Secondary | ICD-10-CM | POA: Insufficient documentation

## 2024-08-10 DIAGNOSIS — Z85828 Personal history of other malignant neoplasm of skin: Secondary | ICD-10-CM | POA: Diagnosis not present

## 2024-08-10 DIAGNOSIS — Z4800 Encounter for change or removal of nonsurgical wound dressing: Secondary | ICD-10-CM | POA: Diagnosis not present

## 2024-08-10 DIAGNOSIS — X58XXXD Exposure to other specified factors, subsequent encounter: Secondary | ICD-10-CM | POA: Diagnosis not present

## 2024-08-10 DIAGNOSIS — S1180XD Unspecified open wound of other specified part of neck, subsequent encounter: Secondary | ICD-10-CM | POA: Diagnosis not present

## 2024-08-10 DIAGNOSIS — L7622 Postprocedural hemorrhage and hematoma of skin and subcutaneous tissue following other procedure: Secondary | ICD-10-CM | POA: Insufficient documentation

## 2024-08-10 DIAGNOSIS — E119 Type 2 diabetes mellitus without complications: Secondary | ICD-10-CM | POA: Insufficient documentation

## 2024-08-10 DIAGNOSIS — Z4801 Encounter for change or removal of surgical wound dressing: Secondary | ICD-10-CM | POA: Diagnosis not present

## 2024-08-10 DIAGNOSIS — Z5189 Encounter for other specified aftercare: Secondary | ICD-10-CM

## 2024-08-10 DIAGNOSIS — S199XXD Unspecified injury of neck, subsequent encounter: Secondary | ICD-10-CM | POA: Diagnosis present

## 2024-08-10 NOTE — ED Provider Notes (Signed)
 [facility] Provider Note    Event Date/Time   First MD Initiated Contact with Patient 08/10/24 [phone]     (approximate)   History   Wound Check (Facial - Recurrent Bleeding )   HPI  Craig [name] is a 89 y.o. male history of hypertension, diabetes, basal cell carcinoma, presents emergency department with continued bleeding of a biopsied area.  The area was biopsied over 2 weeks ago.  Has been seen twice for continued bleeding.  Had a stitch placed about 2 weeks ago.  Has not followed up with his dermatologist that did the biopsy.  Has been applying a nonstick bandage to the area.  States that he got better but he woke up this morning with blood everywhere.      Physical Exam   Triage Vital Signs: ED Triage Vitals  Encounter Vitals Group     BP 08/10/24 0635 (!) 143/90     Girls Systolic BP Percentile --      Girls Diastolic BP Percentile --      Boys Systolic BP Percentile --      Boys Diastolic BP Percentile --      Pulse Rate 08/10/24 0635 77     Resp 08/10/24 0635 19     Temp 08/10/24 0635 97.8 F (36.6 C)     Temp Source 08/10/24 0635 Oral     SpO2 08/10/24 0635 99 %     Weight 08/10/24 0635 153 lb 12.8 oz (69.8 kg)     Height 08/10/24 0635 5' 8 (1.727 m)     Head Circumference --      Peak Flow --      Pain Score 08/10/24 0631 0     Pain Loc --      Pain Education --      Exclude from Growth Chart --     Most recent vital signs: Vitals:   08/10/24 0635  BP: (!) 143/90  Pulse: 77  Resp: 19  Temp: 97.8 F (36.6 C)  SpO2: 99%     General: Awake, no distress.   CV:  Good peripheral perfusion. Resp:  Normal effort.  Abd:  No distention.   Other:  Open wound noted on the left side of the neck, dried blood noted surrounding, small amount of oozing, no foreign body, no cellulitis   ED Results / Procedures / Treatments   Labs (all labs ordered are listed, but only abnormal results are displayed) Labs Reviewed - No data to  display   EKG     RADIOLOGY     PROCEDURES:   Procedures  Critical Care:  no Chief Complaint  Patient presents with   Wound Check    Facial - Recurrent Bleeding       MEDICATIONS ORDERED IN ED: Medications - No data to display   IMPRESSION / MDM / ASSESSMENT AND PLAN / ED COURSE  I reviewed the triage vital signs and the nursing notes.                              Differential diagnosis includes, but is not limited to, wound care, wound check, wound infection  Patient's presentation is most consistent with acute, uncomplicated illness.   I cleaned the area with normal saline, applied Surgicel.  Do not feel something needs to suture as I do not feel it would actually work.  Surgicel should be our best chance of bleeding down  a barrier to help skin heal.  Explained to the patient that he should hold his Eliquis for today.  He can start back on it tomorrow.  Follow-up with the doctor that did the biopsy.  Did have concerns that this possibly could be cancerous since he continues to have bleeding in this area.  Also skin is very thin that could also prevent healing.  Return emergency department if worsening.  Patient is in agreement with treatment plan.  Discharged stable condition.      FINAL CLINICAL IMPRESSION(S) / ED DIAGNOSES   Final diagnoses:  Visit for wound check     Rx / DC Orders   ED Discharge Orders     None        Note:  This document was prepared using Dragon voice recognition software and may include unintentional dictation errors.    Gasper Devere ORN, PA-C 08/10/24 [id number]    Jossie Artist POUR, MD 08/10/24 [phone]

## 2024-08-10 NOTE — Discharge Instructions (Signed)
 We applied surgicel to your wound, do not remove this film as it acts as a layer of skin Call your doctor that did the biopsy, they need to recheck this wound because of the continued bleeding Dont take your eliquis today, but take your normal dose tomorrow

## 2024-08-10 NOTE — ED Triage Notes (Signed)
 Pt returns for repeated bleeding from wound to left chin. Had a stitch placed to control bleeding. Woke up with recurrent bleeding approx 2 hours ago. Pt is on Eliquis.

## 2024-08-11 DIAGNOSIS — L98461 Non-pressure chronic ulcer of face limited to breakdown of skin: Secondary | ICD-10-CM | POA: Diagnosis not present

## 2024-08-11 DIAGNOSIS — Z85828 Personal history of other malignant neoplasm of skin: Secondary | ICD-10-CM | POA: Diagnosis not present

## 2024-08-11 DIAGNOSIS — Z85820 Personal history of malignant melanoma of skin: Secondary | ICD-10-CM | POA: Diagnosis not present

## 2024-09-03 ENCOUNTER — Emergency Department

## 2024-09-03 ENCOUNTER — Inpatient Hospital Stay: Admit: 2024-09-03 | Discharge: 2024-09-03 | Disposition: A | Attending: Hospitalist

## 2024-09-03 ENCOUNTER — Other Ambulatory Visit: Payer: Self-pay

## 2024-09-03 ENCOUNTER — Encounter: Payer: Self-pay | Admitting: Intensive Care

## 2024-09-03 ENCOUNTER — Inpatient Hospital Stay
Admission: EM | Admit: 2024-09-03 | Discharge: 2024-09-07 | DRG: 291 | Disposition: A | Attending: Hospitalist | Admitting: Hospitalist

## 2024-09-03 DIAGNOSIS — I480 Paroxysmal atrial fibrillation: Secondary | ICD-10-CM | POA: Diagnosis not present

## 2024-09-03 DIAGNOSIS — I110 Hypertensive heart disease with heart failure: Secondary | ICD-10-CM | POA: Diagnosis not present

## 2024-09-03 DIAGNOSIS — E7849 Other hyperlipidemia: Secondary | ICD-10-CM | POA: Diagnosis not present

## 2024-09-03 DIAGNOSIS — N400 Enlarged prostate without lower urinary tract symptoms: Secondary | ICD-10-CM | POA: Diagnosis not present

## 2024-09-03 DIAGNOSIS — Z888 Allergy status to other drugs, medicaments and biological substances status: Secondary | ICD-10-CM | POA: Diagnosis not present

## 2024-09-03 DIAGNOSIS — I5023 Acute on chronic systolic (congestive) heart failure: Secondary | ICD-10-CM | POA: Insufficient documentation

## 2024-09-03 DIAGNOSIS — E1169 Type 2 diabetes mellitus with other specified complication: Secondary | ICD-10-CM

## 2024-09-03 DIAGNOSIS — I509 Heart failure, unspecified: Principal | ICD-10-CM

## 2024-09-03 DIAGNOSIS — I7090 Unspecified atherosclerosis: Secondary | ICD-10-CM | POA: Diagnosis not present

## 2024-09-03 DIAGNOSIS — N1831 Chronic kidney disease, stage 3a: Secondary | ICD-10-CM | POA: Diagnosis not present

## 2024-09-03 DIAGNOSIS — I4819 Other persistent atrial fibrillation: Secondary | ICD-10-CM | POA: Diagnosis not present

## 2024-09-03 DIAGNOSIS — Z604 Social exclusion and rejection: Secondary | ICD-10-CM | POA: Diagnosis not present

## 2024-09-03 DIAGNOSIS — I10 Essential (primary) hypertension: Secondary | ICD-10-CM | POA: Diagnosis present

## 2024-09-03 DIAGNOSIS — Z7984 Long term (current) use of oral hypoglycemic drugs: Secondary | ICD-10-CM | POA: Diagnosis not present

## 2024-09-03 DIAGNOSIS — R55 Syncope and collapse: Secondary | ICD-10-CM | POA: Diagnosis not present

## 2024-09-03 DIAGNOSIS — R918 Other nonspecific abnormal finding of lung field: Secondary | ICD-10-CM | POA: Diagnosis not present

## 2024-09-03 DIAGNOSIS — I3481 Nonrheumatic mitral (valve) annulus calcification: Secondary | ICD-10-CM | POA: Diagnosis not present

## 2024-09-03 DIAGNOSIS — Z860100 Personal history of colon polyps, unspecified: Secondary | ICD-10-CM | POA: Diagnosis not present

## 2024-09-03 DIAGNOSIS — E876 Hypokalemia: Secondary | ICD-10-CM | POA: Diagnosis not present

## 2024-09-03 DIAGNOSIS — Z66 Do not resuscitate: Secondary | ICD-10-CM | POA: Diagnosis not present

## 2024-09-03 DIAGNOSIS — J9811 Atelectasis: Secondary | ICD-10-CM | POA: Diagnosis not present

## 2024-09-03 DIAGNOSIS — R0989 Other specified symptoms and signs involving the circulatory and respiratory systems: Secondary | ICD-10-CM | POA: Diagnosis not present

## 2024-09-03 DIAGNOSIS — H409 Unspecified glaucoma: Secondary | ICD-10-CM | POA: Diagnosis not present

## 2024-09-03 DIAGNOSIS — E119 Type 2 diabetes mellitus without complications: Secondary | ICD-10-CM

## 2024-09-03 DIAGNOSIS — I159 Secondary hypertension, unspecified: Secondary | ICD-10-CM | POA: Diagnosis not present

## 2024-09-03 DIAGNOSIS — Z7901 Long term (current) use of anticoagulants: Secondary | ICD-10-CM | POA: Diagnosis not present

## 2024-09-03 DIAGNOSIS — Z79899 Other long term (current) drug therapy: Secondary | ICD-10-CM | POA: Diagnosis not present

## 2024-09-03 DIAGNOSIS — R531 Weakness: Secondary | ICD-10-CM | POA: Diagnosis not present

## 2024-09-03 DIAGNOSIS — Z85828 Personal history of other malignant neoplasm of skin: Secondary | ICD-10-CM | POA: Diagnosis not present

## 2024-09-03 DIAGNOSIS — I130 Hypertensive heart and chronic kidney disease with heart failure and stage 1 through stage 4 chronic kidney disease, or unspecified chronic kidney disease: Secondary | ICD-10-CM | POA: Diagnosis not present

## 2024-09-03 DIAGNOSIS — E785 Hyperlipidemia, unspecified: Secondary | ICD-10-CM | POA: Diagnosis present

## 2024-09-03 DIAGNOSIS — I429 Cardiomyopathy, unspecified: Secondary | ICD-10-CM | POA: Diagnosis not present

## 2024-09-03 DIAGNOSIS — E1122 Type 2 diabetes mellitus with diabetic chronic kidney disease: Secondary | ICD-10-CM | POA: Diagnosis not present

## 2024-09-03 LAB — CBC WITH DIFFERENTIAL/PLATELET
Abs Immature Granulocytes: 0.02 K/uL (ref 0.00–0.07)
Basophils Absolute: 0 K/uL (ref 0.0–0.1)
Basophils Relative: 1 %
Eosinophils Absolute: 0.1 K/uL (ref 0.0–0.5)
Eosinophils Relative: 1 %
HCT: 42.7 % (ref 39.0–52.0)
Hemoglobin: 13.6 g/dL (ref 13.0–17.0)
Immature Granulocytes: 0 %
Lymphocytes Relative: 20 %
Lymphs Abs: 1.2 K/uL (ref 0.7–4.0)
MCH: 31.6 pg (ref 26.0–34.0)
MCHC: 31.9 g/dL (ref 30.0–36.0)
MCV: 99.3 fL (ref 80.0–100.0)
Monocytes Absolute: 0.6 K/uL (ref 0.1–1.0)
Monocytes Relative: 9 %
Neutro Abs: 4.1 K/uL (ref 1.7–7.7)
Neutrophils Relative %: 69 %
Platelets: 85 K/uL — ABNORMAL LOW (ref 150–400)
RBC: 4.3 MIL/uL (ref 4.22–5.81)
RDW: 16 % — ABNORMAL HIGH (ref 11.5–15.5)
WBC: 5.9 K/uL (ref 4.0–10.5)
nRBC: 0 % (ref 0.0–0.2)

## 2024-09-03 LAB — URINALYSIS, ROUTINE W REFLEX MICROSCOPIC
Bilirubin Urine: NEGATIVE
Glucose, UA: NEGATIVE mg/dL
Hgb urine dipstick: NEGATIVE
Ketones, ur: NEGATIVE mg/dL
Leukocytes,Ua: NEGATIVE
Nitrite: NEGATIVE
Protein, ur: NEGATIVE mg/dL
Specific Gravity, Urine: 1.009 (ref 1.005–1.030)
pH: 6 (ref 5.0–8.0)

## 2024-09-03 LAB — COMPREHENSIVE METABOLIC PANEL WITH GFR
ALT: 10 U/L (ref 0–44)
AST: 29 U/L (ref 15–41)
Albumin: 3.4 g/dL — ABNORMAL LOW (ref 3.5–5.0)
Alkaline Phosphatase: 175 U/L — ABNORMAL HIGH (ref 38–126)
Anion gap: 12 (ref 5–15)
BUN: 26 mg/dL — ABNORMAL HIGH (ref 8–23)
CO2: 28 mmol/L (ref 22–32)
Calcium: 8.5 mg/dL — ABNORMAL LOW (ref 8.9–10.3)
Chloride: 104 mmol/L (ref 98–111)
Creatinine, Ser: 1.14 mg/dL (ref 0.61–1.24)
GFR, Estimated: >60 mL/min (ref >60)
Glucose, Bld: 146 mg/dL — ABNORMAL HIGH (ref 70–99)
Potassium: 3 mmol/L — ABNORMAL LOW (ref 3.5–5.1)
Sodium: 144 mmol/L (ref 135–145)
Total Bilirubin: 2.1 mg/dL — ABNORMAL HIGH (ref 0.0–1.2)
Total Protein: 6.2 g/dL — ABNORMAL LOW (ref 6.5–8.1)

## 2024-09-03 LAB — CBG MONITORING, ED
Glucose-Capillary: 112 mg/dL — ABNORMAL HIGH (ref 70–99)
Glucose-Capillary: 128 mg/dL — ABNORMAL HIGH (ref 70–99)

## 2024-09-03 LAB — HEMOGLOBIN A1C
Hgb A1c MFr Bld: 6.7 % — ABNORMAL HIGH (ref 4.8–5.6)
Mean Plasma Glucose: 146 mg/dL

## 2024-09-03 LAB — TROPONIN T, HIGH SENSITIVITY
Troponin T High Sensitivity: 41 ng/L — ABNORMAL HIGH (ref 0–19)
Troponin T High Sensitivity: 39 ng/L — ABNORMAL HIGH (ref 0–19)

## 2024-09-03 LAB — PRO BRAIN NATRIURETIC PEPTIDE: Pro Brain Natriuretic Peptide: 11782 pg/mL — ABNORMAL HIGH (ref <300.0)

## 2024-09-03 MED ORDER — METOPROLOL SUCCINATE ER 25 MG PO TB24
12.5000 mg | ORAL_TABLET | ORAL | Status: DC
  Administered 2024-09-04 – 2024-09-07 (×4): 12.5 mg via ORAL
  Filled 2024-09-03 (×4): qty 1

## 2024-09-03 MED ORDER — POLYETHYLENE GLYCOL 3350 17 G PO PACK: 17.0000 g | PACK | Freq: Every day | ORAL | Status: DC | PRN

## 2024-09-03 MED ORDER — FUROSEMIDE 10 MG/ML IJ SOLN
40.0000 mg | Freq: Two times a day (BID) | INTRAMUSCULAR | Status: DC
  Administered 2024-09-03 – 2024-09-06 (×6): 40 mg via INTRAVENOUS
  Filled 2024-09-03 (×6): qty 4

## 2024-09-03 MED ORDER — FUROSEMIDE 10 MG/ML IJ SOLN
40.0000 mg | Freq: Once | INTRAMUSCULAR | Status: AC
  Administered 2024-09-03: 40 mg via INTRAVENOUS
  Filled 2024-09-03: qty 4

## 2024-09-03 MED ORDER — ROSUVASTATIN CALCIUM 5 MG PO TABS
2.5000 mg | ORAL_TABLET | ORAL | Status: DC
  Administered 2024-09-05: 2.5 mg via ORAL
  Filled 2024-09-03 (×2): qty 1

## 2024-09-03 MED ORDER — ACETAMINOPHEN 325 MG PO TABS: 650.0000 mg | ORAL_TABLET | Freq: Four times a day (QID) | ORAL | Status: DC | PRN

## 2024-09-03 MED ORDER — TERAZOSIN HCL 5 MG PO CAPS
10.0000 mg | ORAL_CAPSULE | Freq: Every day | ORAL | Status: DC
  Administered 2024-09-03 – 2024-09-06 (×4): 10 mg via ORAL
  Filled 2024-09-03 (×5): qty 2

## 2024-09-03 MED ORDER — ONDANSETRON HCL 4 MG/2ML IJ SOLN: 4.0000 mg | Freq: Four times a day (QID) | INTRAMUSCULAR | Status: DC | PRN

## 2024-09-03 MED ORDER — OXYCODONE HCL 5 MG PO TABS: 5.0000 mg | ORAL_TABLET | ORAL | Status: DC | PRN

## 2024-09-03 MED ORDER — INSULIN ASPART 100 UNIT/ML IJ SOLN
0.0000 [IU] | Freq: Three times a day (TID) | INTRAMUSCULAR | Status: DC
  Administered 2024-09-04 – 2024-09-05 (×4): 2 [IU] via SUBCUTANEOUS
  Filled 2024-09-03 (×4): qty 2

## 2024-09-03 MED ORDER — LISINOPRIL 20 MG PO TABS
30.0000 mg | ORAL_TABLET | Freq: Every day | ORAL | Status: DC
  Administered 2024-09-04 – 2024-09-07 (×4): 30 mg via ORAL
  Filled 2024-09-03 (×4): qty 1

## 2024-09-03 MED ORDER — TIMOLOL HEMIHYDRATE 0.5 % OP SOLN: 1.0000 [drp] | Freq: Every day | OPHTHALMIC | Status: DC

## 2024-09-03 MED ORDER — TIMOLOL MALEATE 0.5 % OP SOLN
1.0000 [drp] | Freq: Every day | OPHTHALMIC | Status: DC
  Administered 2024-09-04 – 2024-09-07 (×4): 1 [drp] via OPHTHALMIC
  Filled 2024-09-03 (×2): qty 5

## 2024-09-03 MED ORDER — INSULIN ASPART 100 UNIT/ML IJ SOLN: 0.0000 [IU] | Freq: Every day | INTRAMUSCULAR | Status: DC

## 2024-09-03 MED ORDER — POTASSIUM CHLORIDE 10 MEQ/100ML IV SOLN
10.0000 meq | INTRAVENOUS | Status: AC
  Administered 2024-09-03 (×2): 10 meq via INTRAVENOUS
  Filled 2024-09-03 (×2): qty 100

## 2024-09-03 MED ORDER — ONDANSETRON HCL 4 MG PO TABS: 4.0000 mg | ORAL_TABLET | Freq: Four times a day (QID) | ORAL | Status: DC | PRN

## 2024-09-03 MED ORDER — ACETAMINOPHEN 650 MG RE SUPP: 650.0000 mg | Freq: Four times a day (QID) | RECTAL | Status: DC | PRN

## 2024-09-03 MED ORDER — APIXABAN 2.5 MG PO TABS
2.5000 mg | ORAL_TABLET | Freq: Two times a day (BID) | ORAL | Status: DC
  Administered 2024-09-03 – 2024-09-07 (×8): 2.5 mg via ORAL
  Filled 2024-09-03 (×8): qty 1

## 2024-09-03 NOTE — ED Notes (Signed)
 Pt c/o burning with IV potassium administration. Potassium rate slowed to 50ml/hour with 10ml/hr NS infusing. Pt reports improvement with pain. MD notified.

## 2024-09-03 NOTE — ED Notes (Signed)
 CCMD called and verified patient on cardiac telemetry. Lab called and made aware patient has blood work that needs to be drawn.

## 2024-09-03 NOTE — ED Triage Notes (Signed)
 Patient c/o swelling in bilateral legs and dizziness. Denies pain or sob

## 2024-09-03 NOTE — TOC Initial Note (Signed)
 Transition of Care [facility]) - Initial/Assessment Note    Patient Details  Name: Craig [name] MRN: [id number] Date of Birth: [birth year]  Transition of Care [facility]) CM/SW Contact:    Nathanael CHRISTELLA Ring, RN Phone Number: 09/03/2024, 12:10 PM  Clinical Narrative:                 Met with patient at the bedside in the ED.  He is sitting up in bed, disheveled, had some trouble with the urinal.  He is alert and oriented.  His legs are a little swollen, skin is dry and irritated, skin tare on the back of the right leg, he said the fluid popped the skin.  He is from home where he lives alone and is independent.  He drives.  He was going to [employer] multiple days a week to work out but he has been unable to for the past 6 weeks or so due to being weak.  He would be open to home health services at discharge, he says he has never needed them before.  Provider has placed an order for PT and OT evals.   Expected Discharge Plan: Home w Home Health Services Barriers to Discharge: No Barriers Identified   Patient Goals and CMS Choice Patient states their goals for this hospitalization and ongoing recovery are:: to get back home and back to feeling better CMS Medicare.gov Compare Post Acute Care list provided to:: Patient Choice offered to / list presented to : Patient      Expected Discharge Plan and Services   Discharge Planning Services: CM Consult Post Acute Care Choice: Home Health Living arrangements for the past 2 months: Single Family Home                                      Prior Living Arrangements/Services Living arrangements for the past 2 months: Single Family Home Lives with:: Self Patient language and need for interpreter reviewed:: Yes Do you feel safe going back to the place where you live?: Yes      Need for Family Participation in Patient Care: Yes (Comment) Care giver support system in place?: Yes (comment) Current home services: DME ([employer]) Criminal  Activity/Legal Involvement Pertinent to Current Situation/Hospitalization: No - Comment as needed  Activities of Daily Living      Permission Sought/Granted                  Emotional Assessment Appearance:: Appears stated age Attitude/Demeanor/Rapport: Engaged Affect (typically observed): Accepting Orientation: : Oriented to Self, Oriented to Place, Oriented to  Time, Oriented to Situation Alcohol / Substance Use: Not Applicable Psych Involvement: No (comment)  Admission diagnosis:  CHF (congestive heart failure) (HCC) [I50.9] Patient Active Problem List   Diagnosis Date Noted   CHF (congestive heart failure) (HCC) 09/03/2024   PCP:  Alla Amis, MD Pharmacy:   [facility] [address] ([address]), [place] - [address] [place]) [place] [address] Phone: [phone] Fax: [phone]  [id number] - [place], [place] - [address] [place] [address] Phone: [phone] Fax: [phone]     Social Drivers of Health (SDOH) Social History: SDOH Screenings   Food Insecurity: No Food Insecurity (10/10/2023)   Received from [facility]  Housing: Low Risk  (01/24/2024)   Received from [facility]  Transportation Needs:  No Transportation Needs (10/10/2023)   Received from [facility]  Utilities: Not At Risk (10/10/2023)   Received from [facility]  Financial Resource Strain: Low Risk  (10/10/2023)   Received from [facility]  Tobacco Use: Low Risk  (09/03/2024)   SDOH Interventions:     Readmission Risk Interventions     No data to display

## 2024-09-03 NOTE — Consult Note (Signed)
 [facility] CLINIC CARDIOLOGY CONSULT NOTE       Patient ID: Craig [name] MRN: [id number] DOB/AGE: 01/31/1932 89 y.o.  Admit date: 09/03/2024 Referring Physician Dr. Nena Rebel Primary Physician Alla Amis, MD  Primary Cardiologist Therisa Pierre, PA Reason for Consultation acute heart failure  HPI: Craig [name] is a 89 y.o. male  with a past medical history of chronic HFmrEF, paroxysmal atrial fibrillation, moderate to severe MR, hypertension, hyperlipidemia, type II diabetes, CKD stage IIIa who presented to the ED on 09/03/2024 for leg swelling. Cardiology was consulted for further evaluation.   Patient reports worsening shortness of breath and lower extremity edema over the last 3 weeks.  Given this he decided to come in for evaluation.  Workup in the ED notable for creatinine 1.14, potassium 3.0, hemoglobin 13.6, WBC 5.9. Troponins 41, BNP 11782. EKG in the ED atrial fibrillation PVC rate 77 bpm. CXR with small to moderate R pleural effusion. Started on IV lasix in the ED.   At the time of my evaluation this AM, he is resting comfortably in hospital bed.  We discussed his symptoms in further detail.  Endorses a few weeks of gradually worsening lower extremity edema with associated weeping.  States that he did also have some shortness of breath but felt that this was more related to his allergies.  States that he did discussed with our office yesterday and we had recommended he increase his home Lasix which did increase his urine output but given his gradual worsening he still decided to come in for evaluation.  States that he has had episodes of heart failure exacerbations in the past requiring IV diuresis.  Denies any chest pain, palpitations, dizziness/lightheadedness, syncope.  Review of systems complete and found to be negative unless listed above    Past Medical History:  Diagnosis Date   Aortic atherosclerosis    Basal cell carcinoma of skin    BPH (benign prostatic hyperplasia)     Bradycardia    CKD (chronic kidney disease), stage III (HCC)    Current use of long term anticoagulation    a.) Apixaban   Diverticulosis    GERD (gastroesophageal reflux disease)    Glaucoma    History of kidney stones    Hyperlipidemia    Hypertension    Myalgia due to statin    Paroxysmal atrial fibrillation (HCC)    a.) CHA2DS2-VASc =5 (age x 2, HTN, aortic plaque, T2DM). b.) rate/rhythm maintained on metoprolol. c.) chronically anticoagulated using reduced dose apixaban.   T2DM (type 2 diabetes mellitus) (HCC)    Traumatic subdural hematoma (HCC)    Valvular regurgitation 08/21/2014   a.) TTE 08/21/2014: EF >55%; BAE, mod PHTN; mild AP/PR, mod TR, severe MR. b.) TTE 08/29/2016: EF 55%; triv AR, mild TR, moderate MR/PR. c.) TTE 08/09/2018: EF 40-45%; BAE, mild RV enlargement; trivial AR, mild TR/PR, moderate MR.    Past Surgical History:  Procedure Laterality Date   COLONOSCOPY W/ POLYPECTOMY     SUBDURAL HEMATOMA EVACUATION VIA CRANIOTOMY  2007   trimming tree and branch fell on head   TONSILLECTOMY     VASECTOMY     XI ROBOTIC ASSISTED INGUINAL HERNIA REPAIR WITH MESH Left 08/03/2021   Procedure: XI ROBOTIC ASSISTED INGUINAL HERNIA REPAIR WITH MESH;  Surgeon: Rodolph Romano, MD;  Location: [facility] ORS;  Service: General;  Laterality: Left;    (Not in a hospital admission)  Social History   Socioeconomic History   Marital status: Widowed    Spouse  name: Not on file   Number of children: Not on file   Years of education: Not on file   Highest education level: Not on file  Occupational History   Not on file  Tobacco Use   Smoking status: Never   Smokeless tobacco: Never  Vaping Use   Vaping status: Never Used  Substance and Sexual Activity   Alcohol use: Yes    Comment: occ   Drug use: Never   Sexual activity: Not on file  Other Topics Concern   Not on file  Social History Narrative   Not on file   Social Drivers of Health   Financial Resource Strain:  Low Risk  (10/10/2023)   Received from [facility]   Overall Financial Resource Strain (CARDIA)    Difficulty of Paying Living Expenses: Not hard at all  Food Insecurity: No Food Insecurity (10/10/2023)   Received from [facility]   Hunger Vital Sign    Within the past 12 months, you worried that your food would run out before you got the money to buy more.: Never true    Within the past 12 months, the food you bought just didn't last and you didn't have money to get more.: Never true  Transportation Needs: No Transportation Needs (10/10/2023)   Received from [facility]   PRAPARE - Transportation    Lack of Transportation (Non-Medical): No    In the past 12 months, has lack of transportation kept you from medical appointments or from getting medications?: No  Physical Activity: Not on file  Stress: Not on file  Social Connections: Not on file  Intimate Partner Violence: Not on file    History reviewed. No pertinent family history.   Vitals:   09/03/24 0730 09/03/24 0734 09/03/24 0853  BP:  132/87   Pulse:  79   Resp:  18   Temp:   97.9 F (36.6 C)  TempSrc:   Oral  SpO2:  97%   Weight: 69.8 kg    Height: 5' 8 (1.727 m)      PHYSICAL EXAM General: Chronically ill-appearing elderly male, well nourished, in no acute distress. HEENT: Normocephalic and atraumatic. Neck: No JVD.  Lungs: Normal respiratory effort on room air.  Bibasilar crackles Heart: Irregularly irregular, controlled rate. Normal S1 and S2 without gallops or murmurs.  Abdomen: Non-distended appearing.  Msk: Normal strength and tone for age. Extremities: Warm and well perfused. No clubbing, cyanosis.  2+ pitting edema bilaterally with weeping and blisters.  Neuro: Alert and oriented X 3. Psych: Answers questions appropriately.   Labs: Basic Metabolic Panel: Recent Labs    09/03/24 0806  NA 144  K 3.0*  CL 104  CO2 28  GLUCOSE 146*  BUN 26*   CREATININE 1.14  CALCIUM 8.5*   Liver Function Tests: Recent Labs    09/03/24 0806  AST 29  ALT 10  ALKPHOS 175*  BILITOT 2.1*  PROT 6.2*  ALBUMIN 3.4*   No results for input(s): LIPASE, AMYLASE in the last 72 hours. CBC: Recent Labs    09/03/24 0806  WBC 5.9  NEUTROABS 4.1  HGB 13.6  HCT 42.7  MCV 99.3  PLT 85*   Cardiac Enzymes: No results for input(s): CKTOTAL, CKMB, CKMBINDEX, TROPONINIHS in the last 72 hours. BNP: No results for input(s): BNP in the last 72 hours. D-Dimer: No results for input(s): DDIMER in the last 72 hours. Hemoglobin A1C: No results for input(s): HGBA1C in the  last 72 hours. Fasting Lipid Panel: No results for input(s): CHOL, HDL, LDLCALC, TRIG, CHOLHDL, LDLDIRECT in the last 72 hours. Thyroid  Function Tests: No results for input(s): TSH, T4TOTAL, T3FREE, THYROIDAB in the last 72 hours.  Invalid input(s): FREET3 Anemia Panel: No results for input(s): VITAMINB12, FOLATE, FERRITIN, TIBC, IRON, RETICCTPCT in the last 72 hours.   Radiology: [facility] Chest Port 1 View Result Date: 09/03/2024 EXAM: 1 VIEW(S) XRAY OF THE CHEST 09/03/2024 08:12:00 AM COMPARISON: Chest x-ray 02/11/2011. CLINICAL HISTORY: Leg swelling. FINDINGS: LUNGS AND PLEURA: Low lung volumes. Opacification of the right base likely small to moderate effusion with associated basal atelectasis, although infection is possible. Suggestion of mild associated vascular congestion. No pneumothorax. HEART AND MEDIASTINUM: Mild cardiomegaly. Atherosclerotic calcifications. Calcification of the mitral valve annulus. BONES AND SOFT TISSUES: Multilevel degenerative changes of thoracic spine. IMPRESSION: 1. Right basilar opacification most consistent with small to moderate pleural effusion with associated atelectasis; superimposed infection is possible. 2. Mild cardiomegaly with atherosclerotic vascular calcifications and mitral annular calcification.  Electronically signed by: Toribio Agreste MD 09/03/2024 08:18 AM EST RP Workstation: [id number]    ECHO ordered  TELEMETRY (personally reviewed): Atrial fibrillation rate 70s  EKG (personally reviewed): atrial fibrillation PVC rate 77 bpm  Data reviewed by me 09/03/2024: last 24h vitals tele labs imaging I/O ED provider note, admission H&P  Principal Problem:   CHF (congestive heart failure) (HCC)    ASSESSMENT AND PLAN:  Author STORM SOVINE is a 89 y.o. male  with a past medical history of chronic HFmrEF, paroxysmal atrial fibrillation, moderate to severe MR, hypertension, hyperlipidemia, type II diabetes, CKD stage IIIa who presented to the ED on 09/03/2024 for leg swelling. Cardiology was consulted for further evaluation.   # Acute on chronic HFmrEF # Persistent atrial fibrillation # Moderate to severe MR # Hypertension Patient with worsening SOB and LE edema for 2-3 weeks. BNP significantly elevated 11,000. EKG with AF, rate controlled. Started on IV lasix  in the ED.  -Echo ordered, further recommendations pending these results.  -Continue IV lasix  40 mg twice daily.  -Resume home lisinopril  30 mg to start tomorrow, took dose at home this AM. -Resume home eliquis  2.5 mg twice daily for stroke risk reduction.  -Given acute heart failure and well controlled HR will hold off on resuming home metoprolol  today, consider once volume status improves.  -Minimal most consistent with demand/supply mismatch and not ACS in setting of AoCHF.   This patient's plan of care was discussed and created with Dr. Florencio and he is in agreement.  Signed: Danita Bloch, PA-C  09/03/2024, 10:11 AM [facility]

## 2024-09-03 NOTE — Progress Notes (Signed)
 Heart Failure Navigator Progress Note  Assessed for Heart & Vascular TOC clinic readiness.  Patient does not meet criteria due to current [facility] Cardiology patient.   Navigator will sign off at this time.  Roxy Horseman, RN, BSN [facility] Heart Failure Navigator Secure Chat Only

## 2024-09-03 NOTE — H&P (Signed)
 History and Physical    Ohn BHARGAV BARBARO FMW:[id number] DOB: 30-Apr-1932 DOA: 09/03/2024  DOS: the patient was seen and examined on 09/03/2024  PCP: Alla Amis, MD   Patient coming from: Home  I have personally briefly reviewed patient's old medical records in [facility]  Chief Complaint: Shortness of breath and bilateral lower extremity edema for 3 weeks  HPI: Kariem R Hillyard is a pleasant 89 y.o. male with medical history significant for HFrEF, paroxysmal atrial fibrillation on Eliquis, moderate to severe MR, HTN, HLD, DM, CKD stage IIIa who presented to ED complaining of lower extremity swelling and shortness of breath for 3 weeks duration.  Patient stated that he has been having progressively worsening shortness of breath and he contacted his primary cardiologist who advised him to go up on Lasix.  Even after extra dose of Lasix he did not get better and decided come to the emergency room for evaluation.  He denies any fever, chills, nausea, vomiting, palpitations, abdominal pain.  ED Course: Upon arrival to the ED, patient is found to be in acute congestive heart failure, bilateral leg swelling, potassium 3 proBNP of 11,782, troponin 41, EKG shows atrial fibrillation with controlled response.  Chest x-ray showed mild to moderate right-sided pleural effusion.  Patient was given IV Lasix 40 mg x 1, potassium 10 mEq IV x 1.  Hospitalist service was consulted for evaluation for admission.  Review of Systems:  ROS  All other systems negative except as noted in the HPI.  Past Medical History:  Diagnosis Date   Aortic atherosclerosis    Basal cell carcinoma of skin    BPH (benign prostatic hyperplasia)    Bradycardia    CKD (chronic kidney disease), stage III (HCC)    Current use of long term anticoagulation    a.) Apixaban   Diverticulosis    GERD (gastroesophageal reflux disease)    Glaucoma    History of kidney stones    Hyperlipidemia    Hypertension    Myalgia due to statin     Paroxysmal atrial fibrillation (HCC)    a.) CHA2DS2-VASc =5 (age x 2, HTN, aortic plaque, T2DM). b.) rate/rhythm maintained on metoprolol. c.) chronically anticoagulated using reduced dose apixaban.   T2DM (type 2 diabetes mellitus) (HCC)    Traumatic subdural hematoma (HCC)    Valvular regurgitation 08/21/2014   a.) TTE 08/21/2014: EF >55%; BAE, mod PHTN; mild AP/PR, mod TR, severe MR. b.) TTE 08/29/2016: EF 55%; triv AR, mild TR, moderate MR/PR. c.) TTE 08/09/2018: EF 40-45%; BAE, mild RV enlargement; trivial AR, mild TR/PR, moderate MR.    Past Surgical History:  Procedure Laterality Date   COLONOSCOPY W/ POLYPECTOMY     SUBDURAL HEMATOMA EVACUATION VIA CRANIOTOMY  2007   trimming tree and branch fell on head   TONSILLECTOMY     VASECTOMY     XI ROBOTIC ASSISTED INGUINAL HERNIA REPAIR WITH MESH Left 08/03/2021   Procedure: XI ROBOTIC ASSISTED INGUINAL HERNIA REPAIR WITH MESH;  Surgeon: Rodolph Romano, MD;  Location: [facility] ORS;  Service: General;  Laterality: Left;     reports that he has never smoked. He has never used smokeless tobacco. He reports current alcohol use. He reports that he does not use drugs.  Allergies  Allergen Reactions   Statins Other (See Comments)    Cramps     History reviewed. No pertinent family history.  Prior to Admission medications   Medication Sig Start Date End Date Taking? Authorizing Provider  furosemide (LASIX)  20 MG tablet Take 20 mg by mouth daily. 08/25/24  Yes [provider]  lisinopril (ZESTRIL) 30 MG tablet Take 30 mg by mouth daily. 09/02/24  Yes [provider]  neomycin-polymyxin b-dexamethasone  (MAXITROL) 3.5-10000-0.1 OINT Place 1 Application into both eyes daily. 05/14/24  Yes [provider]  apixaban (ELIQUIS) 2.5 MG TABS tablet Take 2.5 mg by mouth 2 (two) times daily. 06/09/21   [provider]  hydrochlorothiazide (MICROZIDE) 12.5 MG capsule Take 12.5 mg by mouth every morning. 05/31/21    [provider]  metFORMIN (GLUCOPHAGE) 500 MG tablet Take 500 mg by mouth every morning. 05/31/21   [provider]  metoprolol succinate (TOPROL-XL) 25 MG 24 hr tablet Take 12.5 mg by mouth every morning. 05/20/21   [provider]  rosuvastatin (CRESTOR) 5 MG tablet Take 2.5 mg by mouth every Monday, Wednesday, and Friday. pm    [provider]  terazosin (HYTRIN) 10 MG capsule Take 10 mg by mouth at bedtime. 05/31/21   [provider]  timolol (BETIMOL) 0.5 % ophthalmic solution Place 1 drop into the right eye daily.    [provider]    Physical Exam: Vitals:   09/03/24 0730 09/03/24 0734 09/03/24 0853  BP:  132/87   Pulse:  79   Resp:  18   Temp:   97.9 F (36.6 C)  TempSrc:   Oral  SpO2:  97%   Weight: 69.8 kg    Height: 5' 8 (1.727 m)      Physical Exam   Constitutional: Alert, awake, calm, comfortable HEENT: Neck supple, jugular venous distention present Respiratory: Bilateral decreased air entry at the bases with bilateral basal rales.  Cardiovascular: Regular rate and rhythm, no murmurs / rubs / gallops.  Bilateral 3+ pitting edema. 2+ pedal pulses. No carotid bruits.  Abdomen: Soft, no tenderness, Bowel sounds positive.  Musculoskeletal: no clubbing / cyanosis. Good ROM, no contractures. Normal muscle tone.  Skin: no rashes, lesions, ulcers. Neurologic: CN 2-12 grossly intact. Sensation intact, No focal deficit identified Psychiatric: Alert and oriented x 3. Normal mood.    Labs on Admission: I have personally reviewed following labs and imaging studies  CBC: Recent Labs  Lab 09/03/24 0806  WBC 5.9  NEUTROABS 4.1  HGB 13.6  HCT 42.7  MCV 99.3  PLT 85*   Basic Metabolic Panel: Recent Labs  Lab 09/03/24 0806  NA 144  K 3.0*  CL 104  CO2 28  GLUCOSE 146*  BUN 26*  CREATININE 1.14  CALCIUM 8.5*   GFR: Estimated Creatinine Clearance: 41.7 mL/min (by C-G formula based on SCr of 1.14 mg/dL). Liver  Function Tests: Recent Labs  Lab 09/03/24 0806  AST 29  ALT 10  ALKPHOS 175*  BILITOT 2.1*  PROT 6.2*  ALBUMIN 3.4*   No results for input(s): LIPASE, AMYLASE in the last 168 hours. No results for input(s): AMMONIA in the last 168 hours. Coagulation Profile: No results for input(s): INR, PROTIME in the last 168 hours. Cardiac Enzymes: No results for input(s): CKTOTAL, CKMB, CKMBINDEX, TROPONINI, TROPONINIHS in the last 168 hours. BNP (last 3 results) No results for input(s): BNP in the last 8760 hours. HbA1C: No results for input(s): HGBA1C in the last 72 hours. CBG: No results for input(s): GLUCAP in the last 168 hours. Lipid Profile: No results for input(s): CHOL, HDL, LDLCALC, TRIG, CHOLHDL, LDLDIRECT in the last 72 hours. Thyroid  Function Tests: No results for input(s): TSH, T4TOTAL, FREET4, T3FREE, THYROIDAB in the last  72 hours. Anemia Panel: No results for input(s): VITAMINB12, FOLATE, FERRITIN, TIBC, IRON, RETICCTPCT in the last 72 hours. Urine analysis:    Component Value Date/Time   COLORURINE YELLOW (A) 09/03/2024 0957   APPEARANCEUR CLEAR (A) 09/03/2024 0957   LABSPEC 1.009 09/03/2024 0957   PHURINE 6.0 09/03/2024 0957   GLUCOSEU NEGATIVE 09/03/2024 0957   HGBUR NEGATIVE 09/03/2024 0957   BILIRUBINUR NEGATIVE 09/03/2024 0957   KETONESUR NEGATIVE 09/03/2024 0957   PROTEINUR NEGATIVE 09/03/2024 0957   NITRITE NEGATIVE 09/03/2024 0957   LEUKOCYTESUR NEGATIVE 09/03/2024 0957    Radiological Exams on Admission: I have personally reviewed images DG Chest Port 1 View Result Date: 09/03/2024 EXAM: 1 VIEW(S) XRAY OF THE CHEST 09/03/2024 08:12:00 AM COMPARISON: Chest x-ray 02/11/2011. CLINICAL HISTORY: Leg swelling. FINDINGS: LUNGS AND PLEURA: Low lung volumes. Opacification of the right base likely small to moderate effusion with associated basal atelectasis, although infection is possible. Suggestion of  mild associated vascular congestion. No pneumothorax. HEART AND MEDIASTINUM: Mild cardiomegaly. Atherosclerotic calcifications. Calcification of the mitral valve annulus. BONES AND SOFT TISSUES: Multilevel degenerative changes of thoracic spine. IMPRESSION: 1. Right basilar opacification most consistent with small to moderate pleural effusion with associated atelectasis; superimposed infection is possible. 2. Mild cardiomegaly with atherosclerotic vascular calcifications and mitral annular calcification. Electronically signed by: Toribio Agreste MD 09/03/2024 08:18 AM EST RP Workstation: [id number]    EKG: My personal interpretation of EKG shows: A-fib with controlled response    Assessment/Plan Principal Problem:   CHF (congestive heart failure) (HCC) Active Problems:   HTN (hypertension)   PAF (paroxysmal atrial fibrillation) (HCC)   HLD (hyperlipidemia)   DM (diabetes mellitus) (HCC)    Assessment and Plan: 89 year old male with multiple medical problems including but not limited to congestive heart failure, chronic atrial fibrillation on Eliquis, HTN, HLD, diabetes who came into ED complaining of progressively worsening shortness of breath and bilateral lower extremity edema.  1.  Acute exacerbation of systolic congestive heart failure - Patient improved with IV Lasix in the emergency room. - I have started him on IV Lasix twice daily. - CHF protocol - Daily weight, input output charting - Continue home medications including metoprolol, lisinopril and Lasix IV as mentioned above. - Telemetry monitoring, echocardiogram - Cardiology evaluation  2.  Type 2 diabetes - He takes metformin at home. - Will place him on insulin sliding scale - Continue to monitor blood glucose level  3.  HTN/HLD - Resume his home medications including metoprolol XL and lisinopril along with a statin.  4.  Atrial fibrillation - Controlled response - Continue Eliquis at home dose - Continue metoprolol  at home dose  5.  Pleural effusion - Hopefully with diuretics it will get better. - Patient is not currently hypoxemic - Continue to monitor  6.  Moderate to severe MR - Continue diuretics and get echocardiogram - Defer to cardiology for further recommendation  7.  Deconditioning PT/OT     DVT prophylaxis: Eliquis Code Status: DNR/DNI(Do NOT Intubate) Family Communication: None Disposition Plan: Home Consults called: Cardiology Admission status: Inpatient, Telemetry bed   Nena Rebel, MD [facility] 09/03/2024, 12:20 PM

## 2024-09-03 NOTE — ED Provider Notes (Addendum)
 [facility] Provider Note    Event Date/Time   First MD Initiated Contact with Patient 09/03/24 [phone]     (approximate)   History   Leg Swelling   HPI  Craig [name] is a 89 y.o. male with history of atrial fibrillation on Eliquis, metoprolol, CHF on Lasix 20 mg daily he comes in with concerns for leg swelling.  Patient reports for the past 2 to 3 weeks worsening leg swelling.  He talked to his cardiologist on the phone and they told him to increase his Lasix to 40 mg today.  He reports prior to doing this he started to feel really dizzy and had a near syncopal episode but was able to catch himself.  Did not fall did not hit his head on the ground denies any headaches, chest pain, abdominal pain.  He does report a little bit of shortness of breath more with exertion.  He denies any calf tenderness and reports compliance with all of his other medications.   Physical Exam   Triage Vital Signs: ED Triage Vitals  Encounter Vitals Group     BP 09/03/24 0734 132/87     Girls Systolic BP Percentile --      Girls Diastolic BP Percentile --      Boys Systolic BP Percentile --      Boys Diastolic BP Percentile --      Pulse Rate 09/03/24 0734 79     Resp 09/03/24 0734 18     Temp --      Temp src --      SpO2 09/03/24 0734 97 %     Weight 09/03/24 0730 153 lb 12.8 oz (69.8 kg)     Height 09/03/24 0730 5' 8 (1.727 m)     Head Circumference --      Peak Flow --      Pain Score 09/03/24 0730 0     Pain Loc --      Pain Education --      Exclude from Growth Chart --     Most recent vital signs: Vitals:   09/03/24 0734  BP: 132/87  Pulse: 79  Resp: 18  SpO2: 97%     General: Awake, no distress.  CV:  Good peripheral perfusion.  Resp:  Normal effort.  Abd:  No distention.  Soft and nontender Other:  1+ edema noted bilaterally with good distal pulses ED Results / Procedures / Treatments   Labs (all labs ordered are listed, but only abnormal results  are displayed) Labs Reviewed  CBC WITH DIFFERENTIAL/PLATELET - Abnormal; Notable for the following components:      Result Value   RDW 16.0 (*)    Platelets 85 (*)    All other components within normal limits  COMPREHENSIVE METABOLIC PANEL WITH GFR - Abnormal; Notable for the following components:   Potassium 3.0 (*)    Glucose, Bld 146 (*)    BUN 26 (*)    Calcium 8.5 (*)    Total Protein 6.2 (*)    Albumin 3.4 (*)    Alkaline Phosphatase 175 (*)    Total Bilirubin 2.1 (*)    All other components within normal limits  PRO BRAIN NATRIURETIC PEPTIDE - Abnormal; Notable for the following components:   Pro Brain Natriuretic Peptide 11,782.0 (*)    All other components within normal limits  TROPONIN T, HIGH SENSITIVITY - Abnormal; Notable for the following components:   Troponin T High Sensitivity 41 (*)  All other components within normal limits  URINALYSIS, ROUTINE W REFLEX MICROSCOPIC     EKG  My interpretation of EKG:  Atrial fibrillation with a rate of 77 without any ST elevation or T wave inversions, QTc of 501  RADIOLOGY I have reviewed the xray personally and interpreted patient has a moderate size right sided pleural effusion.   PROCEDURES:  Critical Care performed: No  .1-3 Lead EKG Interpretation  Performed by: Ernest Ronal BRAVO, MD Authorized by: Ernest Ronal BRAVO, MD     Interpretation: normal     ECG rate:  70   ECG rate assessment: normal     Rhythm: atrial fibrillation     Ectopy: none     Conduction: normal      MEDICATIONS ORDERED IN ED: Medications  potassium chloride 10 mEq in 100 mL IVPB (has no administration in time range)  furosemide (LASIX) injection 40 mg (has no administration in time range)     IMPRESSION / MDM / ASSESSMENT AND PLAN / ED COURSE  I reviewed the triage vital signs and the nursing notes.   Patient's presentation is most consistent with acute presentation with potential threat to life or bodily function.   Patient  concerns for near syncopal episode in the setting of possible CHF exacerbation.  Patient not hypoxic but does report some shortness of breath and worsening leg swelling.  Workup was done evaluate for ACS, CHF.  PE seems less likely given patient has been compliant with Eliquis.  No trauma today.  BNP is elevated.  Troponin elevated.  CBC slightly low platelets similar to prior CMP slightly low potassium will give some repletion given we are going to diurese patient further.  IMPRESSION: 1. Right basilar opacification most consistent with small to moderate pleural effusion with associated atelectasis; superimposed infection is possible. 2. Mild cardiomegaly with atherosclerotic vascular calcifications and mitral annular calcification.  Denies any pneumonia symptoms.  8:59 AM given patient's age with near syncopal episode in the setting of CHF exacerbation I did recommend admission for IV diuresis for cardiac monitoring, monitoring blood pressures.  Orthostatics were negative.  Patient felt comfortable with this plan I will discuss with the hospital team for admission.  The patient is on the cardiac monitor to evaluate for evidence of arrhythmia and/or significant heart rate changes.      FINAL CLINICAL IMPRESSION(S) / ED DIAGNOSES   Final diagnoses:  Acute on chronic congestive heart failure, unspecified heart failure type (HCC)  Near syncope     Rx / DC Orders   ED Discharge Orders     None        Note:  This document was prepared using Dragon voice recognition software and may include unintentional dictation errors.   Ernest Ronal BRAVO, MD 09/03/24 0900    Ernest Ronal BRAVO, MD 09/03/24 [phone]

## 2024-09-03 NOTE — ED Notes (Signed)
 Called CCMD to place pt on central monitoring

## 2024-09-03 NOTE — Progress Notes (Signed)
*  PRELIMINARY RESULTS* Echocardiogram 2D Echocardiogram has been performed.  Craig [name] 09/03/2024, 1:30 PM

## 2024-09-03 NOTE — ED Notes (Signed)
 X-ray at bedside.

## 2024-09-04 DIAGNOSIS — I480 Paroxysmal atrial fibrillation: Secondary | ICD-10-CM

## 2024-09-04 DIAGNOSIS — E876 Hypokalemia: Secondary | ICD-10-CM

## 2024-09-04 DIAGNOSIS — I159 Secondary hypertension, unspecified: Secondary | ICD-10-CM

## 2024-09-04 DIAGNOSIS — E785 Hyperlipidemia, unspecified: Secondary | ICD-10-CM

## 2024-09-04 DIAGNOSIS — E1169 Type 2 diabetes mellitus with other specified complication: Secondary | ICD-10-CM

## 2024-09-04 LAB — GLUCOSE, CAPILLARY
Glucose-Capillary: 117 mg/dL — ABNORMAL HIGH (ref 70–99)
Glucose-Capillary: 182 mg/dL — ABNORMAL HIGH (ref 70–99)
Glucose-Capillary: 141 mg/dL — ABNORMAL HIGH (ref 70–99)
Glucose-Capillary: 108 mg/dL — ABNORMAL HIGH (ref 70–99)
Glucose-Capillary: 59 mg/dL — ABNORMAL LOW (ref 70–99)

## 2024-09-04 LAB — COMPREHENSIVE METABOLIC PANEL WITH GFR
ALT: 11 U/L (ref 0–44)
AST: 29 U/L (ref 15–41)
Albumin: 3.4 g/dL — ABNORMAL LOW (ref 3.5–5.0)
Alkaline Phosphatase: 170 U/L — ABNORMAL HIGH (ref 38–126)
Anion gap: 12 (ref 5–15)
BUN: 24 mg/dL — ABNORMAL HIGH (ref 8–23)
CO2: 32 mmol/L (ref 22–32)
Calcium: 8.5 mg/dL — ABNORMAL LOW (ref 8.9–10.3)
Chloride: 97 mmol/L — ABNORMAL LOW (ref 98–111)
Creatinine, Ser: 1.19 mg/dL (ref 0.61–1.24)
GFR, Estimated: 58 mL/min — ABNORMAL LOW (ref >60)
Glucose, Bld: 112 mg/dL — ABNORMAL HIGH (ref 70–99)
Potassium: 2.5 mmol/L — CL (ref 3.5–5.1)
Sodium: 141 mmol/L (ref 135–145)
Total Bilirubin: 2.2 mg/dL — ABNORMAL HIGH (ref 0.0–1.2)
Total Protein: 6.1 g/dL — ABNORMAL LOW (ref 6.5–8.1)

## 2024-09-04 LAB — CBC
HCT: 41.1 % (ref 39.0–52.0)
Hemoglobin: 13.5 g/dL (ref 13.0–17.0)
MCH: 31.5 pg (ref 26.0–34.0)
MCHC: 32.8 g/dL (ref 30.0–36.0)
MCV: 96 fL (ref 80.0–100.0)
Platelets: 91 K/uL — ABNORMAL LOW (ref 150–400)
RBC: 4.28 MIL/uL (ref 4.22–5.81)
RDW: 15.9 % — ABNORMAL HIGH (ref 11.5–15.5)
WBC: 5.7 K/uL (ref 4.0–10.5)
nRBC: 0 % (ref 0.0–0.2)

## 2024-09-04 LAB — ECHOCARDIOGRAM COMPLETE
Height: 68 in
S' Lateral: 3.9 cm
Weight: 2460.79 [oz_av]

## 2024-09-04 LAB — MAGNESIUM: Magnesium: 1.7 mg/dL (ref 1.7–2.4)

## 2024-09-04 LAB — PROTIME-INR
INR: 1.8 — ABNORMAL HIGH (ref 0.8–1.2)
Prothrombin Time: 21.5 s — ABNORMAL HIGH (ref 11.4–15.2)

## 2024-09-04 LAB — POTASSIUM: Potassium: 3.7 mmol/L (ref 3.5–5.1)

## 2024-09-04 MED ORDER — POTASSIUM CHLORIDE CRYS ER 20 MEQ PO TBCR
40.0000 meq | EXTENDED_RELEASE_TABLET | Freq: Once | ORAL | Status: AC
  Administered 2024-09-04: 40 meq via ORAL
  Filled 2024-09-04: qty 2

## 2024-09-04 MED ORDER — POTASSIUM CHLORIDE CRYS ER 20 MEQ PO TBCR
40.0000 meq | EXTENDED_RELEASE_TABLET | ORAL | Status: AC
  Administered 2024-09-04 (×2): 40 meq via ORAL
  Filled 2024-09-04 (×2): qty 2

## 2024-09-04 MED ORDER — MAGNESIUM SULFATE 2 GM/50ML IV SOLN
2.0000 g | Freq: Once | INTRAVENOUS | Status: AC
  Administered 2024-09-04: 2 g via INTRAVENOUS
  Filled 2024-09-04: qty 50

## 2024-09-04 MED ORDER — SPIRONOLACTONE 12.5 MG HALF TABLET
12.5000 mg | ORAL_TABLET | Freq: Every day | ORAL | Status: DC
  Administered 2024-09-04: 12.5 mg via ORAL
  Filled 2024-09-04 (×3): qty 1

## 2024-09-04 MED ORDER — POTASSIUM CHLORIDE 10 MEQ/100ML IV SOLN
10.0000 meq | INTRAVENOUS | Status: AC
  Administered 2024-09-04 (×3): 10 meq via INTRAVENOUS
  Filled 2024-09-04 (×6): qty 100

## 2024-09-04 NOTE — Assessment & Plan Note (Signed)
 Continue blood pressure control with lisinopril and metoprolol  Add spironolactone  Continued diuresis with furosemide Follow up on blood pressure

## 2024-09-04 NOTE — Assessment & Plan Note (Addendum)
 Patient with improved volume status, pending echocardiogram.   Urine output 4,150 ml Systolic blood pressure 120 mmHg systolic,.   Plan to continue diuresis with furosemide 40 mg IV bid Medical therapy with metoprolol, (low dose) and lisinopril, possible transition to ARB during this hospitalization.  Add spironolactone and consider SGLT 2 in, to augment diuresis.  Follow up on echocardiogram results.  Acute cardiogenic pulmonary edema, with right pleural effusion. Continue diuresis.  02 saturation today 98% on room air.   High sensitive troponin elevation due to heart failure exacerbation, ruled out acute coronary syndrome.

## 2024-09-04 NOTE — Progress Notes (Signed)
 PHARMACY CONSULT NOTE - FOLLOW UP  Pharmacy Consult for Electrolyte Monitoring and Replacement   Recent Labs: Potassium (mmol/L)  Date Value  09/04/2024 3.7  01/31/2013 4.2   Magnesium (mg/dL)  Date Value  87/95/7974 1.7   Calcium (mg/dL)  Date Value  87/95/7974 8.5 (L)   Calcium, Total (mg/dL)  Date Value  94/97/7985 8.5   Albumin (g/dL)  Date Value  87/95/7974 3.4 (L)  01/29/2013 2.9 (L)   Sodium (mmol/L)  Date Value  09/04/2024 141  01/31/2013 142    Assessment: HFmrEF, paroxysmal atrial fibrillation, moderate to severe MR, hypertension, hyperlipidemia, type II diabetes, CKD stage III . Pharmacy consulted to manage electrolytes while inpatient. Patient admitted with acute HF and started on furosemide IV.   Goal of Therapy:  K>4, Mg >2, and all other electrolytes WNL  Plan:  - Will order magnesium 2g IV and KCL 40mEq PO x 1 dose - Recheck electrolytes with AM labs -Pharmacy will continue to monitor and replace as needed.    Estill CHRISTELLA Lutes ,PharmD Clinical Pharmacist 09/04/2024 6:23 PM

## 2024-09-04 NOTE — Progress Notes (Signed)
 Progress Note   Patient: Craig [name] FMW:[id number] DOB: [birth year] DOA: 09/03/2024     1 DOS: the patient was seen and examined on 09/04/2024   Brief hospital course: Mr. Eskelson was admitted to the hospital with the working diagnosis of heart failure exacerbation.   89 yo male with the past medical history of heart failure, paroxysmal atrial fibrillation, hypertension, hyperlipidemia, CKD and T2DM who presented with dyspnea and lower extremity edema.  Reported worsening symptoms for the last 3 weeks prior to admission. He contacted his cardiologist and was instructed to increase the dose of his diuretic. Unfortunately his symptoms continue to worsen, prompting him to come to the ED.  On his initial physical examination his blood pressure was 13/87, HR 79, RR 18 and 02 saturation 97% Lungs with decreased breath sounds bilaterally, with no wheezing or rhonchi, heart with S1 and S2 present and regular with positive systolic murmur at the apex, abdomen with no distention and positive pitting lower extremity edema +++.   Na 144, K 3.0 Cl 104 bicarbonate 28 glucose 146, bun 26 cr 1,14 AST 19 ALT 10  Pro BNP 11,782 High sensitive troponin 41 and 39  Wbc 5.9 hgb 13,6 plt 85   Chest radiograph with cardiomegaly, with bilateral hilar vascular congestion, bilateral central interstitial infiltrates, with right pleural effusion with compressive atelectasis.   EKG 77 bpm, right axis, qtc 501, atrial fibrillation rhythm with PVC, with no significant ST segment or T wave changes.   Patient was placed on furosemide for diuresis.  12/04 volume status is improving.     Assessment and Plan: * Acute on chronic systolic CHF (congestive heart failure) (HCC) Patient with improved volume status, pending echocardiogram.   Urine output 4,150 ml Systolic blood pressure 120 mmHg systolic,.   Plan to continue diuresis with furosemide 40 mg IV bid Medical therapy with metoprolol, (low dose) and lisinopril,  possible transition to ARB during this hospitalization.  Add spironolactone and consider SGLT 2 in, to augment diuresis.  Follow up on echocardiogram results.  Acute cardiogenic pulmonary edema, with right pleural effusion. Continue diuresis.  02 saturation today 98% on room air.   High sensitive troponin elevation due to heart failure exacerbation, ruled out acute coronary syndrome.   HTN (hypertension) Continue blood pressure control with lisinopril and metoprolol  Add spironolactone  Continued diuresis with furosemide Follow up on blood pressure   PAF (paroxysmal atrial fibrillation) (HCC) Continue rate control with metoprolol and anticoagulation with apixaban.   Hypokalemia Follow up renal function with serum cr at 1,15 with K at 2,5 and serum bicarbonate at 32 Na 141  Patient had total of 120 meq KCl today, will continue diuresis and close follow up electrolytes and renal function.   Type 2 diabetes mellitus with hyperlipidemia (HCC) Continue glucose cover and monitoring with insulin sliding scale Fasting glucose 112 mg/dl   Continue statin   Consult nutrition       Subjective: Patient with no chest pain, dyspnea and edema are improving, not yet back to baseline   Physical Exam: Vitals:   09/04/24 0505 09/04/24 0819 09/04/24 1159 09/04/24 1443  BP:  129/77 121/79   Pulse:  72 60   Resp:  18 17   Temp:  (!) 97.4 F (36.3 C) (!) 97.4 F (36.3 C)   TempSrc:      SpO2:  100% 98% 95%  Weight: 71 kg     Height:       Neurology awake and alert ENT with  mild pallor with no icterus Cardiovascular with S1 and S2 present and tachycardic with no gallops, or rubs, positive systolic murmur at the apex Moderate JVD Respiratory with bilateral rales at bases with no wheezing or rhonchi  Abdomen with no distention, soft and non tender Lower extremity edema pitting ++  Data Reviewed:    Family Communication: no family at the bedside   Disposition: Status is:  Inpatient Remains inpatient appropriate because: IV furosemide for diuresis   Planned Discharge Destination: Home     Author: Elidia Toribio Furnace, MD 09/04/2024 4:17 PM  For on call review [email or web address].

## 2024-09-04 NOTE — Assessment & Plan Note (Signed)
 Follow up renal function with serum cr at 1,15 with K at 2,5 and serum bicarbonate at 32 Na 141  Patient had total of 120 meq KCl today, will continue diuresis and close follow up electrolytes and renal function.

## 2024-09-04 NOTE — Assessment & Plan Note (Signed)
New diagnosis 1 month ago.  Remains in atrial fibrillation with controlled rate.  Patient has elected against anticoagulation due to history of epistaxis.  CHA2DS2-VASc score is at least 4 suggesting a 4.8% yearly stroke risk.  Risk versus benefits discussed with patient.  Recommended he discuss further with his cardiology team. -Continue Toprol-XL -Continue discussions regarding anticoagulation 

## 2024-09-04 NOTE — Evaluation (Signed)
 Physical Therapy Evaluation Patient Details Name: Craig [name] MRN: [id number] DOB: 07/23/1932 Today's Date: 09/04/2024  History of Present Illness  presented to ER secondary to SOB, LE edema; admitted for management of CHF exacerbation.  Clinical Impression  Patient resting in bed upon arrival to session; alert and oriented to basic information, follows commands, pleasant and cooperative throughout session.  Exceptionally HOH (no 'better' ear identified); family working to secure batteries/charger for hearing aides. Bilat UE/LE strength and ROM grossly symmetrical and WFL; no focal weakness appreciated.  No pain reported.  Able to complete bed mobility with min assist; sit/stand, standing balance, basic transfers and gait (100') with RW, cga/min assist.  Demonstrates partially reciprocal stepping pattern with fair step height/length; fair cadence and overall gait speed. No overt buckling or LOB. HR 70s, O2 90s with gait efforts; minimal SOB noted/endorsed  Would benefit from skilled PT to address above deficits and promote optimal return to PLOF.; recommend post-acute PT follow up as indicated by interdisciplinary care team.            If plan is discharge home, recommend the following: A little help with walking and/or transfers;A little help with bathing/dressing/bathroom   Can travel by private vehicle        Equipment Recommendations Rolling walker (2 wheels)  Recommendations for Other Services       Functional Status Assessment Patient has had a recent decline in their functional status and demonstrates the ability to make significant improvements in function in a reasonable and predictable amount of time.     Precautions / Restrictions Precautions Precautions: Fall Restrictions Weight Bearing Restrictions Per Provider Order: No      Mobility  Bed Mobility Overal bed mobility: Needs Assistance Bed Mobility: Supine to Sit     Supine to sit: Min assist           Transfers Overall transfer level: Needs assistance   Transfers: Sit to/from Stand, Bed to chair/wheelchair/BSC Sit to Stand: Min assist Stand pivot transfers: Contact guard assist, Min assist         General transfer comment: cuing for hand placement to prevent pulling on RW    Ambulation/Gait Ambulation/Gait assistance: Contact guard assist Gait Distance (Feet): 100 Feet Assistive device: Rolling walker (2 wheels)         General Gait Details: partially reciprocal stepping pattern with fair step height/length; fair cadence and overall gait speed.  No overt buckling or LOB.  HR 70s, O2 90s with gait efforts; minimal SOB noted/endorsed  Stairs            Wheelchair Mobility     Tilt Bed    Modified Rankin (Stroke Patients Only)       Balance Overall balance assessment: Needs assistance Sitting-balance support: No upper extremity supported, Feet supported Sitting balance-Leahy Scale: Good     Standing balance support: Bilateral upper extremity supported Standing balance-Leahy Scale: Fair                               Pertinent Vitals/Pain Pain Assessment Pain Assessment: No/denies pain    Home Living Family/patient expects to be discharged to:: Private residence Living Arrangements: Alone Available Help at Discharge: Family;Available PRN/intermittently Type of Home: Apartment Home Access: Stairs to enter   Entrance Stairs-Number of Steps: 2   Home Layout: One level Home Equipment: Cane - single point      Prior Function Prior Level of Function : Independent/Modified Independent;Driving  Mobility Comments: Indep with mobility and ADLs without assist device; recent use of SPC in recent weeks due to progressive weakness.  Denies fall history.  No home O2.       Extremity/Trunk Assessment   Upper Extremity Assessment Upper Extremity Assessment: Overall WFL for tasks assessed    Lower Extremity Assessment Lower  Extremity Assessment: Overall WFL for tasks assessed (grossly at least 4-/5 throughout; skin tear to posterior R shin (covered, dry))       Communication   Communication Factors Affecting Communication: Hearing impaired (exceptionally HOH; family working to barista for hearing aides)    Cognition Arousal: Alert Behavior During Therapy: WFL for tasks assessed/performed   PT - Cognitive impairments: No apparent impairments                         Following commands: Intact       Cueing Cueing Techniques: Verbal cues, Gestural cues, Tactile cues     General Comments      Exercises     Assessment/Plan    PT Assessment Patient needs continued PT services  PT Problem List Decreased activity tolerance;Decreased balance;Decreased mobility;Decreased coordination;Decreased cognition;Decreased knowledge of use of DME;Decreased safety awareness;Decreased knowledge of precautions;Cardiopulmonary status limiting activity       PT Treatment Interventions DME instruction;Gait training;Stair training;Functional mobility training;Therapeutic activities;Therapeutic exercise;Balance training;Cognitive remediation;Patient/family education    PT Goals (Current goals can be found in the Care Plan section)  Acute Rehab PT Goals Patient Stated Goal: to return home PT Goal Formulation: With patient Time For Goal Achievement: 09/18/24 Potential to Achieve Goals: Good    Frequency Min 2X/week     Co-evaluation               AM-PAC PT 6 Clicks Mobility  Outcome Measure Help needed turning from your back to your side while in a flat bed without using bedrails?: None Help needed moving from lying on your back to sitting on the side of a flat bed without using bedrails?: A Little Help needed moving to and from a bed to a chair (including a wheelchair)?: A Little Help needed standing up from a chair using your arms (e.g., wheelchair or bedside chair)?: A  Little Help needed to walk in hospital room?: A Little Help needed climbing 3-5 steps with a railing? : A Little 6 Click Score: 19    End of Session   Activity Tolerance: Patient tolerated treatment well Patient left: in chair;with call bell/phone within reach;with chair alarm set Nurse Communication: Mobility status PT Visit Diagnosis: Muscle weakness (generalized) (M62.81);Difficulty in walking, not elsewhere classified (R26.2)    Time: 8581-8563 PT Time Calculation (min) (ACUTE ONLY): 18 min   Charges:   PT Evaluation $PT Eval Low Complexity: 1 Low   PT General Charges $$ ACUTE PT VISIT: 1 Visit         Kaleb Linquist H. Delores, PT, DPT, NCS 09/04/24, 2:51 PM [phone]

## 2024-09-04 NOTE — Progress Notes (Addendum)
 PHARMACY CONSULT NOTE - FOLLOW UP  Pharmacy Consult for Electrolyte Monitoring and Replacement   Recent Labs: Potassium (mmol/L)  Date Value  09/04/2024 2.5 (LL)  01/31/2013 4.2   Calcium (mg/dL)  Date Value  87/95/7974 8.5 (L)   Calcium, Total (mg/dL)  Date Value  94/97/7985 8.5   Albumin (g/dL)  Date Value  87/95/7974 3.4 (L)  01/29/2013 2.9 (L)   Sodium (mmol/L)  Date Value  09/04/2024 141  01/31/2013 142    Assessment: HFmrEF, paroxysmal atrial fibrillation, moderate to severe MR, hypertension, hyperlipidemia, type II diabetes, CKD stage III . Pharmacy consulted to manage electrolytes while inpatient. Patient admitted with acute HF and started on furosemide IV.  12/4 @ 0422:  K = 2.5, Ca = 8.5,  Alb = 3.4,  Corrected Ca = 8.98  Goal of Therapy:  K>4, Mg >2, and all other electrolytes WNL  Plan:  - will order KCl 10 mEq IV X 6 to start @ 0700. - recheck K @ 1500.   Silver Selinda BIRCH ,PharmD Clinical Pharmacist 09/04/2024 6:44 AM  Addendum:  RN called to request K replacement oral if possible. Noted carb modified diet, and taking other PO meds. Due to low Kcl and diuresis, I will change orders to Kcl 10meq IV x 3 doses plus Kcl po 40meq po q4hrs x 2 doses. Will check next K today at 1600 (4 hours after last K replacement) Repeat BMP, Mg and Phos with AM labs  Willetta York Rodriguez-Guzman PharmD, BCPS 09/04/2024 8:34 AM

## 2024-09-04 NOTE — Hospital Course (Signed)
 Craig [name] was admitted to the hospital with the working diagnosis of heart failure exacerbation.   89 yo male with the past medical history of heart failure, paroxysmal atrial fibrillation, hypertension, hyperlipidemia, CKD and T2DM who presented with dyspnea and lower extremity edema.  Reported worsening symptoms for the last 3 weeks prior to admission. He contacted his cardiologist and was instructed to increase the dose of his diuretic. Unfortunately his symptoms continue to worsen, prompting him to come to the ED.  On his initial physical examination his blood pressure was 13/87, HR 79, RR 18 and 02 saturation 97% Lungs with decreased breath sounds bilaterally, with no wheezing or rhonchi, heart with S1 and S2 present and regular with positive systolic murmur at the apex, abdomen with no distention and positive pitting lower extremity edema +++.   Na 144, K 3.0 Cl 104 bicarbonate 28 glucose 146, bun 26 cr 1,14 AST 19 ALT 10  Pro BNP 11,782 High sensitive troponin 41 and 39  Wbc 5.9 hgb 13,6 plt 85   Chest radiograph with cardiomegaly, with bilateral hilar vascular congestion, bilateral central interstitial infiltrates, with right pleural effusion with compressive atelectasis.   EKG 77 bpm, right axis, qtc 501, atrial fibrillation rhythm with PVC, with no significant ST segment or T wave changes.   Patient was placed on furosemide for diuresis.  12/04 volume status is improving.

## 2024-09-04 NOTE — Consult Note (Signed)
 WOC Nurse Consult Note:  WOC conuslt performed remotely utilizing imaging and chart review Reason for Consult: Patient has wounds on Right posterior calf  Wound type: Grade 2 skin tear to R posterior LE Pressure Injury POA: NA- not pressure  Measurement: see nursing flow sheets  Wound bed: 100 % red, moist Drainage (amount, consistency, odor) see nursing flow sheets Periwound: intact Dressing procedure/placement/frequency:  Cleanse wound with NS, pat dry. Apply Xeroform to wound bed and cover with silicone foam dressing.  Change daily.      WOC team will not follow patient at this time, please re consult if new needs arise or wound deteriorates.  Thank you,  Doyal Polite, MSN, RN, [facility] WOC Team [phone] (Available Mon-Fri 0700-1500)

## 2024-09-04 NOTE — Progress Notes (Signed)
 9692 - The patient has a heart rhythm of atrial fibrillation.  The monitor tech identified a non-sustained 7 beat run of V-tach.  The MD was notified and the patient continues to be closely monitored.  9444 - Critical potassium 2.5.  Awaiting potassium supplementation orders from Dr. Cleatus.

## 2024-09-04 NOTE — Progress Notes (Signed)
 [facility] CARDIOLOGY PROGRESS NOTE       Patient ID: Craig [name] MRN: [id number] DOB/AGE: 1931/10/19 89 y.o.  Admit date: 09/03/2024 Referring Physician Dr. Nena Rebel Primary Physician Alla Amis, MD  Primary Cardiologist Therisa Pierre, PA Reason for Consultation acute heart failure  HPI: Craig [name] is a 89 y.o. male  with a past medical history of chronic HFmrEF, paroxysmal atrial fibrillation, moderate to severe MR, hypertension, hyperlipidemia, type II diabetes, CKD stage IIIa who presented to the ED on 09/03/2024 for leg swelling. Cardiology was consulted for further evaluation.   Interval history: -Patient seen and examined this AM, resting in hospital bed.  -Feeling better today overall, less SOB. Still with significant LE edema.  -BP and HR controlled, tele unremarkable.  Review of systems complete and found to be negative unless listed above    Past Medical History:  Diagnosis Date   Aortic atherosclerosis    Basal cell carcinoma of skin    BPH (benign prostatic hyperplasia)    Bradycardia    CKD (chronic kidney disease), stage III (HCC)    Current use of long term anticoagulation    a.) Apixaban   Diverticulosis    GERD (gastroesophageal reflux disease)    Glaucoma    History of kidney stones    Hyperlipidemia    Hypertension    Myalgia due to statin    Paroxysmal atrial fibrillation (HCC)    a.) CHA2DS2-VASc =5 (age x 2, HTN, aortic plaque, T2DM). b.) rate/rhythm maintained on metoprolol. c.) chronically anticoagulated using reduced dose apixaban.   T2DM (type 2 diabetes mellitus) (HCC)    Traumatic subdural hematoma (HCC)    Valvular regurgitation 08/21/2014   a.) TTE 08/21/2014: EF >55%; BAE, mod PHTN; mild AP/PR, mod TR, severe MR. b.) TTE 08/29/2016: EF 55%; triv AR, mild TR, moderate MR/PR. c.) TTE 08/09/2018: EF 40-45%; BAE, mild RV enlargement; trivial AR, mild TR/PR, moderate MR.    Past Surgical History:  Procedure Laterality Date    COLONOSCOPY W/ POLYPECTOMY     SUBDURAL HEMATOMA EVACUATION VIA CRANIOTOMY  2007   trimming tree and branch fell on head   TONSILLECTOMY     VASECTOMY     XI ROBOTIC ASSISTED INGUINAL HERNIA REPAIR WITH MESH Left 08/03/2021   Procedure: XI ROBOTIC ASSISTED INGUINAL HERNIA REPAIR WITH MESH;  Surgeon: Rodolph Romano, MD;  Location: [facility] ORS;  Service: General;  Laterality: Left;    Medications Prior to Admission  Medication Sig Dispense Refill Last Dose/Taking   apixaban (ELIQUIS) 2.5 MG TABS tablet Take 2.5 mg by mouth 2 (two) times daily.   09/03/2024 Morning   furosemide (LASIX) 20 MG tablet Take 20 mg by mouth daily.   09/03/2024 Morning   lisinopril (ZESTRIL) 30 MG tablet Take 30 mg by mouth daily.   09/03/2024 Morning   metFORMIN (GLUCOPHAGE) 500 MG tablet Take 500 mg by mouth every morning.   09/03/2024 Morning   metoprolol succinate (TOPROL-XL) 25 MG 24 hr tablet Take 12.5 mg by mouth every morning.   09/03/2024 Morning   neomycin-polymyxin b-dexamethasone  (MAXITROL) 3.5-10000-0.1 OINT Place 1 Application into both eyes daily.   09/03/2024 Morning   rosuvastatin (CRESTOR) 5 MG tablet Take 2.5 mg by mouth every Monday, Wednesday, and Friday. pm   09/02/2024 Evening   terazosin (HYTRIN) 10 MG capsule Take 10 mg by mouth at bedtime.   09/02/2024 Evening   timolol (BETIMOL) 0.5 % ophthalmic solution Place 1 drop into the right eye daily.   09/03/2024  Morning   hydrochlorothiazide (MICROZIDE) 12.5 MG capsule Take 12.5 mg by mouth every morning. (Patient not taking: Reported on 09/03/2024)   Not Taking   Social History   Socioeconomic History   Marital status: Widowed    Spouse name: Not on file   Number of children: Not on file   Years of education: Not on file   Highest education level: Not on file  Occupational History   Not on file  Tobacco Use   Smoking status: Never   Smokeless tobacco: Never  Vaping Use   Vaping status: Never Used  Substance and Sexual Activity   Alcohol use:  Yes    Comment: occ   Drug use: Never   Sexual activity: Not on file  Other Topics Concern   Not on file  Social History Narrative   Not on file   Social Drivers of Health   Financial Resource Strain: Low Risk  (10/10/2023)   Received from [facility]   Overall Financial Resource Strain (CARDIA)    Difficulty of Paying Living Expenses: Not hard at all  Food Insecurity: No Food Insecurity (09/03/2024)   Hunger Vital Sign    Worried About Running Out of Food in the Last Year: Never true    Ran Out of Food in the Last Year: Never true  Transportation Needs: No Transportation Needs (09/03/2024)   PRAPARE - Administrator, Civil Service (Medical): No    Lack of Transportation (Non-Medical): No  Physical Activity: Not on file  Stress: Not on file  Social Connections: Socially Isolated (09/03/2024)   Social Connection and Isolation Panel    Frequency of Communication with Friends and Family: Twice a week    Frequency of Social Gatherings with Friends and Family: Once a week    Attends Religious Services: Never    Database Administrator or Organizations: No    Attends Banker Meetings: Never    Marital Status: Widowed  Intimate Partner Violence: Not At Risk (09/03/2024)   Humiliation, Afraid, Rape, and Kick questionnaire    Fear of Current or Ex-Partner: No    Emotionally Abused: No    Physically Abused: No    Sexually Abused: No    History reviewed. No pertinent family history.   Vitals:   09/03/24 2307 09/04/24 0333 09/04/24 0505 09/04/24 0819  BP: 129/88 (!) 125/95  129/77  Pulse: (!) 59 70  72  Resp: 18 18  18   Temp: (!) 97.5 F (36.4 C) (!) 97.4 F (36.3 C)  (!) 97.4 F (36.3 C)  TempSrc: Oral     SpO2: 97% 98%  100%  Weight: 71 kg  71 kg   Height: 5' 7.99 (1.727 m)       PHYSICAL EXAM General: Chronically ill-appearing elderly male, well nourished, in no acute distress. HEENT: Normocephalic and atraumatic. Neck: No JVD.   Lungs: Normal respiratory effort on room air.  Bibasilar crackles Heart: Irregularly irregular, controlled rate. Normal S1 and S2 without gallops or murmurs.  Abdomen: Non-distended appearing.  Msk: Normal strength and tone for age. Extremities: Warm and well perfused. No clubbing, cyanosis.  2-3+ pitting edema bilaterally with weeping and blisters.  Neuro: Alert and oriented X 3. Psych: Answers questions appropriately.   Labs: Basic Metabolic Panel: Recent Labs    09/03/24 0806 09/04/24 0422  NA 144 141  K 3.0* 2.5*  CL 104 97*  CO2 28 32  GLUCOSE 146* 112*  BUN 26* 24*  CREATININE  1.14 1.19  CALCIUM 8.5* 8.5*   Liver Function Tests: Recent Labs    09/03/24 0806 09/04/24 0422  AST 29 29  ALT 10 11  ALKPHOS 175* 170*  BILITOT 2.1* 2.2*  PROT 6.2* 6.1*  ALBUMIN 3.4* 3.4*   No results for input(s): LIPASE, AMYLASE in the last 72 hours. CBC: Recent Labs    09/03/24 0806 09/04/24 0422  WBC 5.9 5.7  NEUTROABS 4.1  --   HGB 13.6 13.5  HCT 42.7 41.1  MCV 99.3 96.0  PLT 85* 91*   Cardiac Enzymes: No results for input(s): CKTOTAL, CKMB, CKMBINDEX, TROPONINIHS in the last 72 hours. BNP: No results for input(s): BNP in the last 72 hours. D-Dimer: No results for input(s): DDIMER in the last 72 hours. Hemoglobin A1C: Recent Labs    09/03/24 1259  HGBA1C 6.7*   Fasting Lipid Panel: No results for input(s): CHOL, HDL, LDLCALC, TRIG, CHOLHDL, LDLDIRECT in the last 72 hours. Thyroid  Function Tests: No results for input(s): TSH, T4TOTAL, T3FREE, THYROIDAB in the last 72 hours.  Invalid input(s): FREET3 Anemia Panel: No results for input(s): VITAMINB12, FOLATE, FERRITIN, TIBC, IRON, RETICCTPCT in the last 72 hours.   Radiology: [facility] Chest Port 1 View Result Date: 09/03/2024 EXAM: 1 VIEW(S) XRAY OF THE CHEST 09/03/2024 08:12:00 AM COMPARISON: Chest x-ray 02/11/2011. CLINICAL HISTORY: Leg swelling. FINDINGS: LUNGS AND  PLEURA: Low lung volumes. Opacification of the right base likely small to moderate effusion with associated basal atelectasis, although infection is possible. Suggestion of mild associated vascular congestion. No pneumothorax. HEART AND MEDIASTINUM: Mild cardiomegaly. Atherosclerotic calcifications. Calcification of the mitral valve annulus. BONES AND SOFT TISSUES: Multilevel degenerative changes of thoracic spine. IMPRESSION: 1. Right basilar opacification most consistent with small to moderate pleural effusion with associated atelectasis; superimposed infection is possible. 2. Mild cardiomegaly with atherosclerotic vascular calcifications and mitral annular calcification. Electronically signed by: Toribio Agreste MD 09/03/2024 08:18 AM EST RP Workstation: [id number]    ECHO pending  TELEMETRY (personally reviewed): Atrial fibrillation rate 60s, PVCs  EKG (personally reviewed): atrial fibrillation PVC rate 77 bpm  Data reviewed by me 09/04/2024: last 24h vitals tele labs imaging I/O ED provider note, admission H&P, hospitalist progress note  Principal Problem:   CHF (congestive heart failure) (HCC) Active Problems:   HTN (hypertension)   PAF (paroxysmal atrial fibrillation) (HCC)   HLD (hyperlipidemia)   DM (diabetes mellitus) (HCC)    ASSESSMENT AND PLAN:  Craig [name] is a 89 y.o. male  with a past medical history of chronic HFmrEF, paroxysmal atrial fibrillation, moderate to severe MR, hypertension, hyperlipidemia, type II diabetes, CKD stage IIIa who presented to the ED on 09/03/2024 for leg swelling. Cardiology was consulted for further evaluation.   # Acute on chronic HFmrEF # Persistent atrial fibrillation # Moderate to severe MR # Hypertension Patient with worsening SOB and LE edema for 2-3 weeks. BNP significantly elevated 11,000. EKG with AF, rate controlled. Started on IV lasix in the ED.  -Echo pending, further recommendations pending these results.  -Continue IV lasix 40 mg twice  daily.  -Continue home lisinopril 30 mg. -Continue home eliquis 2.5 mg twice daily for stroke risk reduction.  -Resume home metoprolol succinate 12.5 mg daily.  -Minimal trops most consistent with demand/supply mismatch and not ACS in setting of AoCHF.   This patient's plan of care was discussed and created with Dr. Florencio and he is in agreement.  Signed: Danita Bloch, PA-C  09/04/2024, 8:31 AM [facility] Cardiology

## 2024-09-04 NOTE — Evaluation (Signed)
 Occupational Therapy Evaluation Patient Details Name: Craig [name] MRN: [id number] DOB: [birth year] Today's Date: 09/04/2024   History of Present Illness   Pt admitted with CHF exacerbation. PMH significant for CHF, paroxysmal atrial fibrillation, HTN, hyperlipidemia, CKD and T2DM.     Clinical Impressions Pt admitted with above. Prior to admission, pt was independent with ADLs/IADLs/driving. Reported use of SPC for recent mobility in the past 2-3 weeks with progressive weakness. Pt presents with impaired strength, balance and decreased functional activity tolerance for ADL performance. Pt extremely HOH, family to bring hearing aide chargers. Pt requires CGA for functional transfers + mobility t/f bathroom with RW; increased difficulties performing STS from standard height commode. Would recommend BSC and grab bar installation at home for increased independence and energy conservation. VSS on RA. Pt would benefit from skilled OT services to address noted impairments and functional limitations (see below for any additional details) in order to maximize safety and independence while minimizing falls risk and caregiver burden. Anticipate the need for follow up [facility] OT services upon acute hospital DC.      If plan is discharge home, recommend the following:   A little help with walking and/or transfers;A little help with bathing/dressing/bathroom;Supervision due to cognitive status;Assist for transportation;Help with stairs or ramp for entrance     Functional Status Assessment   Patient has had a recent decline in their functional status and demonstrates the ability to make significant improvements in function in a reasonable and predictable amount of time.     Equipment Recommendations   BSC/3in1 (grab bars for bathroom)      Precautions/Restrictions   Precautions Precautions: Fall Restrictions Weight Bearing Restrictions Per Provider Order: No     Mobility Bed Mobility Overal  bed mobility: Needs Assistance             General bed mobility comments: NT pt recieved up in recliner, left with RN in room    Transfers Overall transfer level: Needs assistance Equipment used: Rolling walker (2 wheels) Transfers: Sit to/from Stand, Bed to chair/wheelchair/BSC Sit to Stand: Contact guard assist Stand pivot transfers: Contact guard assist         General transfer comment: cues for hand placement      Balance Overall balance assessment: Needs assistance Sitting-balance support: No upper extremity supported, Feet supported Sitting balance-Leahy Scale: Good     Standing balance support: Bilateral upper extremity supported Standing balance-Leahy Scale: Fair                             ADL either performed or assessed with clinical judgement   ADL Overall ADL's : Needs assistance/impaired             Lower Body Bathing: Supervison/ safety;Sit to/from stand Lower Body Bathing Details (indicate cue type and reason): pt simulated LB dressing supervision-CGA         Toilet Transfer: Contact guard assist;Ambulation;Regular Toilet;Rolling walker (2 wheels) Toilet Transfer Details (indicate cue type and reason): t/f bathroom using RW, increased difficulties transfering using standard commode with heavy use of GB. recommend raised toilet seat/BSC over standard commode for increased independence Toileting- Clothing Manipulation and Hygiene: Minimal assistance       Functional mobility during ADLs: Contact guard assist;Rolling walker (2 wheels)       Vision Baseline Vision/History: 0 No visual deficits Ability to See in Adequate Light: 0 Adequate Patient Visual Report: No change from baseline  Pertinent Vitals/Pain Pain Assessment Pain Assessment: No/denies pain     Extremity/Trunk Assessment Upper Extremity Assessment Upper Extremity Assessment: Overall WFL for tasks assessed   Lower Extremity Assessment Lower  Extremity Assessment: Overall WFL for tasks assessed   Cervical / Trunk Assessment Cervical / Trunk Assessment: Normal   Communication Communication Communication: Impaired Factors Affecting Communication: Hearing impaired (significantly HOH, family just left hospital to get batteries for hearing aides)   Cognition Arousal: Alert Behavior During Therapy: WFL for tasks assessed/performed Cognition: No apparent impairments                               Following commands: Intact       Cueing  General Comments   Cueing Techniques: Verbal cues;Gestural cues;Tactile cues  VSS on RA. Pt with bleeding from L arm IV site, RN in room to address. Bandage on R anterior shin.           Home Living Family/patient expects to be discharged to:: Private residence Living Arrangements: Alone Available Help at Discharge: Family;Available PRN/intermittently Type of Home: Apartment Home Access: Stairs to enter Entrance Stairs-Number of Steps: 2 Entrance Stairs-Rails: Right Home Layout: One level     Bathroom Shower/Tub: Chief Strategy Officer: Standard     Home Equipment: Cane - single point          Prior Functioning/Environment Prior Level of Function : Independent/Modified Independent;Driving             Mobility Comments: independent without AD. Pt started using SPC ~2 wks ago 2/2 progressive weakness. ADLs Comments: independent    OT Problem List: Decreased strength;Decreased activity tolerance;Impaired balance (sitting and/or standing);Decreased knowledge of use of DME or AE   OT Treatment/Interventions: Self-care/ADL training;Therapeutic exercise;DME and/or AE instruction;Therapeutic activities;Patient/family education;Balance training      OT Goals(Current goals can be found in the care plan section)   Acute Rehab OT Goals OT Goal Formulation: With patient Time For Goal Achievement: 09/18/24 Potential to Achieve Goals: Good   OT  Frequency:  Min 2X/week       AM-PAC OT 6 Clicks Daily Activity     Outcome Measure Help from another person eating meals?: None Help from another person taking care of personal grooming?: None Help from another person toileting, which includes using toliet, bedpan, or urinal?: A Little Help from another person bathing (including washing, rinsing, drying)?: A Little Help from another person to put on and taking off regular upper body clothing?: A Little Help from another person to put on and taking off regular lower body clothing?: A Little 6 Click Score: 20   End of Session Equipment Utilized During Treatment: Gait belt;Rolling walker (2 wheels) Nurse Communication: Mobility status  Activity Tolerance: Patient tolerated treatment well Patient left: in chair;with call bell/phone within reach;with nursing/sitter in room;with chair alarm set  OT Visit Diagnosis: Muscle weakness (generalized) (M62.81);Unsteadiness on feet (R26.81);Other abnormalities of gait and mobility (R26.89)                Time: 1445-1500 OT Time Calculation (min): 15 min Charges:  OT General Charges $OT Visit: 1 Visit OT Evaluation $OT Eval Low Complexity: 1 Low Ahmir Bracken L. Shy Guallpa, OTR/L  09/04/24, 5:17 PM

## 2024-09-04 NOTE — Assessment & Plan Note (Addendum)
 Continue glucose cover and monitoring with insulin sliding scale Fasting glucose 112 mg/dl   Continue statin   Consult nutrition

## 2024-09-05 DIAGNOSIS — I5023 Acute on chronic systolic (congestive) heart failure: Secondary | ICD-10-CM | POA: Diagnosis not present

## 2024-09-05 LAB — BASIC METABOLIC PANEL WITH GFR
Anion gap: 11 (ref 5–15)
BUN: 27 mg/dL — ABNORMAL HIGH (ref 8–23)
CO2: 30 mmol/L (ref 22–32)
Calcium: 8.6 mg/dL — ABNORMAL LOW (ref 8.9–10.3)
Chloride: 99 mmol/L (ref 98–111)
Creatinine, Ser: 1.2 mg/dL (ref 0.61–1.24)
GFR, Estimated: 57 mL/min — ABNORMAL LOW (ref >60)
Glucose, Bld: 110 mg/dL — ABNORMAL HIGH (ref 70–99)
Potassium: 3.8 mmol/L (ref 3.5–5.1)
Sodium: 141 mmol/L (ref 135–145)

## 2024-09-05 LAB — GLUCOSE, CAPILLARY
Glucose-Capillary: 106 mg/dL — ABNORMAL HIGH (ref 70–99)
Glucose-Capillary: 171 mg/dL — ABNORMAL HIGH (ref 70–99)
Glucose-Capillary: 128 mg/dL — ABNORMAL HIGH (ref 70–99)
Glucose-Capillary: 176 mg/dL — ABNORMAL HIGH (ref 70–99)
Glucose-Capillary: 107 mg/dL — ABNORMAL HIGH (ref 70–99)

## 2024-09-05 LAB — PHOSPHORUS: Phosphorus: 2.7 mg/dL (ref 2.5–4.6)

## 2024-09-05 LAB — MAGNESIUM: Magnesium: 2.2 mg/dL (ref 1.7–2.4)

## 2024-09-05 MED ORDER — POTASSIUM CHLORIDE CRYS ER 20 MEQ PO TBCR
40.0000 meq | EXTENDED_RELEASE_TABLET | Freq: Once | ORAL | Status: AC
  Administered 2024-09-05: 40 meq via ORAL
  Filled 2024-09-05: qty 2

## 2024-09-05 MED ORDER — SPIRONOLACTONE 25 MG PO TABS
25.0000 mg | ORAL_TABLET | Freq: Every day | ORAL | Status: DC
  Administered 2024-09-05 – 2024-09-07 (×3): 25 mg via ORAL
  Filled 2024-09-05 (×3): qty 1

## 2024-09-05 NOTE — TOC CM/SW Note (Signed)
 Patient is not able to walk the distance required to go the bathroom, or he/she is unable to safely negotiate stairs required to access the bathroom.  A 3in1 BSC will alleviate this problem

## 2024-09-05 NOTE — TOC Progression Note (Signed)
 Transition of Care [facility]) - Progression Note    Patient Details  Name: DOMONICK SITTNER MRN: [id number] Date of Birth: 11/29/1931  [facility] CM/SW Contact  Lauraine JAYSON Carpen, LCSW Phone Number: 09/05/2024, 3:50 PM  Clinical Narrative: Ordered 3-in-1 through [employer].    Expected Discharge Plan: Home w Home Health Services Barriers to Discharge: No Barriers Identified               Expected Discharge Plan and Services   Discharge Planning Services: CM Consult Post Acute Care Choice: Home Health Living arrangements for the past 2 months: Single Family Home                                       Social Drivers of Health (SDOH) Interventions SDOH Screenings   Food Insecurity: No Food Insecurity (09/03/2024)  Housing: Low Risk  (09/03/2024)  Transportation Needs: No Transportation Needs (09/03/2024)  Utilities: Not At Risk (09/03/2024)  Financial Resource Strain: Low Risk  (10/10/2023)   Received from [facility]  Social Connections: Socially Isolated (09/03/2024)  Tobacco Use: Low Risk  (09/03/2024)    Readmission Risk Interventions     No data to display

## 2024-09-05 NOTE — Progress Notes (Signed)
 PHARMACY CONSULT NOTE - FOLLOW UP  Pharmacy Consult for Electrolyte Monitoring and Replacement   Recent Labs: Potassium (mmol/L)  Date Value  09/05/2024 3.8  01/31/2013 4.2   Magnesium  (mg/dL)  Date Value  87/94/7974 2.2   Calcium  (mg/dL)  Date Value  87/94/7974 8.6 (L)   Calcium , Total (mg/dL)  Date Value  94/97/7985 8.5   Albumin (g/dL)  Date Value  87/95/7974 3.4 (L)  01/29/2013 2.9 (L)   Phosphorus (mg/dL)  Date Value  87/94/7974 2.7   Sodium (mmol/L)  Date Value  09/05/2024 141  01/31/2013 142   Assessment: HFmrEF, paroxysmal atrial fibrillation, moderate to severe MR, hypertension, hyperlipidemia, type II diabetes, CKD stage III . Patient admitted with acute HF and started on furosemide  IV. Pharmacy consulted to manage electrolytes while inpatient.   Goal of Therapy:  K>4, Mg >2, and all other electrolytes WNL  Plan:  - Will order Kcl po 40 meq po x 1 dose. - Check renal function panel and magnesium  with AM labs. - Pharmacy will continue to monitor and replace as needed.    Raine Elsass Rodriguez-Guzman PharmD, BCPS 09/05/2024 7:21 AM

## 2024-09-05 NOTE — Care Management Important Message (Signed)
 Important Message  Patient Details  Name: Craig [name] MRN: [id number] Date of Birth: 1932/09/10   Important Message Given:  Yes - Medicare IM     Jumaane Weatherford W, CMA 09/05/2024, 12:44 PM

## 2024-09-05 NOTE — Progress Notes (Signed)
   09/05/24 1438  Spiritual Encounters  Type of Visit Initial  Care provided to: Patient  Referral source Patient request  Reason for visit Routine spiritual support  OnCall Visit Yes  Interventions  Spiritual Care Interventions Made Established relationship of care and support;Compassionate presence;Reflective listening;Narrative/life review;Encouragement;Prayer   Chaplain met with patient and offered compassionate presence, compassionate and supportive conversation, reflective listening and discussion regarding life stories.  Wife passed away last year, sons are in touch and supportive.  Realistic discussion regarding steps that will need to be made when he leaves the hospital.  Offered prayer for recovery and return home.

## 2024-09-05 NOTE — TOC Progression Note (Signed)
 Transition of Care [facility]) - Progression Note    Patient Details  Name: Craig [name] MRN: [id number] Date of Birth: Apr 27, 1932  [facility]) CM/SW Contact  Shasta DELENA Daring, RN Phone Number: 09/05/2024, 11:58 AM  Clinical Narrative:    Patient accepted HH services from [facility]. Notified Kelsey with [facility] of acceptance. They will include PT, OT and RN. Patient also amenable to 3 in 1. Already has walker in home.    Following for discharge readiness.   Expected Discharge Plan: Home w Home Health Services Barriers to Discharge: No Barriers Identified               Expected Discharge Plan and Services   Discharge Planning Services: CM Consult Post Acute Care Choice: Home Health Living arrangements for the past 2 months: Single Family Home                                       Social Drivers of Health (SDOH) Interventions SDOH Screenings   Food Insecurity: No Food Insecurity (09/03/2024)  Housing: Low Risk  (09/03/2024)  Transportation Needs: No Transportation Needs (09/03/2024)  Utilities: Not At Risk (09/03/2024)  Financial Resource Strain: Low Risk  (10/10/2023)   Received from [facility]  Social Connections: Socially Isolated (09/03/2024)  Tobacco Use: Low Risk  (09/03/2024)    Readmission Risk Interventions     No data to display

## 2024-09-05 NOTE — Progress Notes (Signed)
  PROGRESS NOTE    Craig [name]  FMW:[id number] DOB: 1931/10/03 DOA: 09/03/2024 PCP: Alla Amis, MD  [id number]  LOS: 2 days   Brief hospital course:   Assessment & Plan: Mr. Nunn was admitted to the hospital with the working diagnosis of heart failure exacerbation.    89 yo male with the past medical history of heart failure, paroxysmal atrial fibrillation, hypertension, hyperlipidemia, CKD and T2DM who presented with dyspnea and lower extremity edema.  Reported worsening symptoms for the last 3 weeks prior to admission. He contacted his cardiologist and was instructed to increase the dose of his diuretic. Unfortunately his symptoms continue to worsen, prompting him to come to the ED.   * Acute on chronic systolic CHF (congestive heart failure) (HCC) --current Echo LVEF 30-35%. --CXR showed Right basilar opacification most consistent with small to moderate pleural effusion with associated atelectasis --cardio consulted --cont IV lasix  40 BID --cont Toprol , Lisinopril  --add spironolactone    HTN (hypertension) --cont IV lasix  40 BID --cont Toprol , Lisinopril  --add spironolactone    PAF (paroxysmal atrial fibrillation) (HCC) --cont Toprol  --cont Eliquis    Hypokalemia Hypomag --monitor and supplement PRN   Type 2 diabetes mellitus with hyperlipidemia (HCC) --A1c 6.7 --d/c BG checks    DVT prophylaxis: On:Eliquis  Code Status: DNR  Family Communication:  Level of care: Telemetry Dispo:   The patient is from: home Anticipated d/c is to: home Anticipated d/c date is: 1-2 days   Subjective and Interval History:  Good urine output.   Objective: Vitals:   09/05/24 0508 09/05/24 0816 09/05/24 1309 09/05/24 1713  BP:  128/85 115/72 129/80  Pulse:  75 60 98  Resp:      Temp:  (!) 97.4 F (36.3 C) (!) 97.3 F (36.3 C) 98.2 F (36.8 C)  TempSrc:      SpO2:  98% 95% 97%  Weight: 72.5 kg     Height:        Intake/Output Summary (Last 24 hours) at  09/05/2024 1849 Last data filed at 09/05/2024 1415 Gross per 24 hour  Intake 360 ml  Output 1775 ml  Net -1415 ml   Filed Weights   09/03/24 2307 09/04/24 0505 09/05/24 0508  Weight: 71 kg 71 kg 72.5 kg    Examination:   Constitutional: NAD, AAOx3 CV: No cyanosis.   RESP: normal respiratory effort, on RA Extremities: edema in BLE Neuro: II - XII grossly intact.   Psych: Normal mood and affect.  Appropriate judgement and reason   Data Reviewed: I have personally reviewed labs and imaging studies  Time spent: 50 minutes  Ellouise Haber, MD [facility] If 7PM-7AM, please contact night-coverage 09/05/2024, 6:49 PM

## 2024-09-05 NOTE — Plan of Care (Signed)
  Problem: Skin Integrity: Goal: Risk for impaired skin integrity will decrease Outcome: Progressing   Problem: Clinical Measurements: Goal: Ability to maintain clinical measurements within normal limits will improve Outcome: Progressing   Problem: Pain Managment: Goal: General experience of comfort will improve and/or be controlled Outcome: Progressing   Problem: Safety: Goal: Ability to remain free from injury will improve Outcome: Progressing

## 2024-09-05 NOTE — Progress Notes (Signed)
 [facility] CLINIC CARDIOLOGY PROGRESS NOTE       Patient ID: Craig [name] MRN: [id number] DOB/AGE: 03-17-1932 89 y.o.  Admit date: 09/03/2024 Referring Physician Dr. Nena Rebel Primary Physician Alla Amis, MD  Primary Cardiologist Therisa Pierre, PA Reason for Consultation acute heart failure  HPI: Craig [name] is a 89 y.o. male  with a past medical history of chronic HFmrEF, paroxysmal atrial fibrillation, moderate to severe MR, hypertension, hyperlipidemia, type II diabetes, CKD stage IIIa who presented to the ED on 09/03/2024 for leg swelling. Cardiology was consulted for further evaluation.   Interval history: -Patient seen and examined this AM, resting in hospital bed.  -Feeling better today overall, denies SOB. LE edema improved today but still present. -BP and HR controlled, tele unremarkable.  Review of systems complete and found to be negative unless listed above    Past Medical History:  Diagnosis Date   Aortic atherosclerosis    Basal cell carcinoma of skin    BPH (benign prostatic hyperplasia)    Bradycardia    CKD (chronic kidney disease), stage III (HCC)    Current use of long term anticoagulation    a.) Apixaban    Diverticulosis    GERD (gastroesophageal reflux disease)    Glaucoma    History of kidney stones    Hyperlipidemia    Hypertension    Myalgia due to statin    Paroxysmal atrial fibrillation (HCC)    a.) CHA2DS2-VASc =5 (age x 2, HTN, aortic plaque, T2DM). b.) rate/rhythm maintained on metoprolol . c.) chronically anticoagulated using reduced dose apixaban .   T2DM (type 2 diabetes mellitus) (HCC)    Traumatic subdural hematoma (HCC)    Valvular regurgitation 08/21/2014   a.) TTE 08/21/2014: EF >55%; BAE, mod PHTN; mild AP/PR, mod TR, severe MR. b.) TTE 08/29/2016: EF 55%; triv AR, mild TR, moderate MR/PR. c.) TTE 08/09/2018: EF 40-45%; BAE, mild RV enlargement; trivial AR, mild TR/PR, moderate MR.    Past Surgical History:  Procedure Laterality  Date   COLONOSCOPY W/ POLYPECTOMY     SUBDURAL HEMATOMA EVACUATION VIA CRANIOTOMY  2007   trimming tree and branch fell on head   TONSILLECTOMY     VASECTOMY     XI ROBOTIC ASSISTED INGUINAL HERNIA REPAIR WITH MESH Left 08/03/2021   Procedure: XI ROBOTIC ASSISTED INGUINAL HERNIA REPAIR WITH MESH;  Surgeon: Rodolph Romano, MD;  Location: [facility] ORS;  Service: General;  Laterality: Left;    Medications Prior to Admission  Medication Sig Dispense Refill Last Dose/Taking   apixaban  (ELIQUIS ) 2.5 MG TABS tablet Take 2.5 mg by mouth 2 (two) times daily.   09/03/2024 Morning   furosemide  (LASIX ) 20 MG tablet Take 20 mg by mouth daily.   09/03/2024 Morning   lisinopril  (ZESTRIL ) 30 MG tablet Take 30 mg by mouth daily.   09/03/2024 Morning   metFORMIN (GLUCOPHAGE) 500 MG tablet Take 500 mg by mouth every morning.   09/03/2024 Morning   metoprolol  succinate (TOPROL -XL) 25 MG 24 hr tablet Take 12.5 mg by mouth every morning.   09/03/2024 Morning   neomycin-polymyxin b-dexamethasone  (MAXITROL) 3.5-10000-0.1 OINT Place 1 Application into both eyes daily.   09/03/2024 Morning   rosuvastatin  (CRESTOR ) 5 MG tablet Take 2.5 mg by mouth every Monday, Wednesday, and Friday. pm   09/02/2024 Evening   terazosin  (HYTRIN ) 10 MG capsule Take 10 mg by mouth at bedtime.   09/02/2024 Evening   timolol  (BETIMOL ) 0.5 % ophthalmic solution Place 1 drop into the right eye daily.  09/03/2024 Morning   hydrochlorothiazide (MICROZIDE) 12.5 MG capsule Take 12.5 mg by mouth every morning. (Patient not taking: Reported on 09/03/2024)   Not Taking   Social History   Socioeconomic History   Marital status: Widowed    Spouse name: Not on file   Number of children: Not on file   Years of education: Not on file   Highest education level: Not on file  Occupational History   Not on file  Tobacco Use   Smoking status: Never   Smokeless tobacco: Never  Vaping Use   Vaping status: Never Used  Substance and Sexual Activity    Alcohol use: Yes    Comment: occ   Drug use: Never   Sexual activity: Not on file  Other Topics Concern   Not on file  Social History Narrative   Not on file   Social Drivers of Health   Financial Resource Strain: Low Risk  (10/10/2023)   Received from [facility]   Overall Financial Resource Strain (CARDIA)    Difficulty of Paying Living Expenses: Not hard at all  Food Insecurity: No Food Insecurity (09/03/2024)   Hunger Vital Sign    Worried About Running Out of Food in the Last Year: Never true    Ran Out of Food in the Last Year: Never true  Transportation Needs: No Transportation Needs (09/03/2024)   PRAPARE - Administrator, Civil Service (Medical): No    Lack of Transportation (Non-Medical): No  Physical Activity: Not on file  Stress: Not on file  Social Connections: Socially Isolated (09/03/2024)   Social Connection and Isolation Panel    Frequency of Communication with Friends and Family: Twice a week    Frequency of Social Gatherings with Friends and Family: Once a week    Attends Religious Services: Never    Database Administrator or Organizations: No    Attends Banker Meetings: Never    Marital Status: Widowed  Intimate Partner Violence: Not At Risk (09/03/2024)   Humiliation, Afraid, Rape, and Kick questionnaire    Fear of Current or Ex-Partner: No    Emotionally Abused: No    Physically Abused: No    Sexually Abused: No    History reviewed. No pertinent family history.   Vitals:   09/04/24 2034 09/04/24 2346 09/05/24 0330 09/05/24 0508  BP: 105/71 109/80 119/88   Pulse: (!) 45  72   Resp: 18 20 18    Temp: (!) 97.5 F (36.4 C) (!) 97.4 F (36.3 C) (!) 97.4 F (36.3 C)   TempSrc:      SpO2: 91% 100% 98%   Weight:    72.5 kg  Height:        PHYSICAL EXAM General: Chronically ill-appearing elderly male, well nourished, in no acute distress. HEENT: Normocephalic and atraumatic. Neck: No JVD.  Lungs: Normal  respiratory effort on room air.  Bibasilar crackles Heart: Irregularly irregular, controlled rate. Normal S1 and S2 without gallops or murmurs.  Abdomen: Non-distended appearing.  Msk: Normal strength and tone for age. Extremities: Warm and well perfused. No clubbing, cyanosis.  2-3+ pitting edema bilaterally with weeping and blisters.  Neuro: Alert and oriented X 3. Psych: Answers questions appropriately.   Labs: Basic Metabolic Panel: Recent Labs    09/04/24 0422 09/04/24 1558 09/05/24 0417  NA 141  --  141  K 2.5* 3.7 3.8  CL 97*  --  99  CO2 32  --  30  GLUCOSE  112*  --  110*  BUN 24*  --  27*  CREATININE 1.19  --  1.20  CALCIUM  8.5*  --  8.6*  MG  --  1.7 2.2  PHOS  --   --  2.7   Liver Function Tests: Recent Labs    09/03/24 0806 09/04/24 0422  AST 29 29  ALT 10 11  ALKPHOS 175* 170*  BILITOT 2.1* 2.2*  PROT 6.2* 6.1*  ALBUMIN 3.4* 3.4*   No results for input(s): LIPASE, AMYLASE in the last 72 hours. CBC: Recent Labs    09/03/24 0806 09/04/24 0422  WBC 5.9 5.7  NEUTROABS 4.1  --   HGB 13.6 13.5  HCT 42.7 41.1  MCV 99.3 96.0  PLT 85* 91*   Cardiac Enzymes: No results for input(s): CKTOTAL, CKMB, CKMBINDEX, TROPONINIHS in the last 72 hours. BNP: No results for input(s): BNP in the last 72 hours. D-Dimer: No results for input(s): DDIMER in the last 72 hours. Hemoglobin A1C: Recent Labs    09/03/24 1259  HGBA1C 6.7*   Fasting Lipid Panel: No results for input(s): CHOL, HDL, LDLCALC, TRIG, CHOLHDL, LDLDIRECT in the last 72 hours. Thyroid  Function Tests: No results for input(s): TSH, T4TOTAL, T3FREE, THYROIDAB in the last 72 hours.  Invalid input(s): FREET3 Anemia Panel: No results for input(s): VITAMINB12, FOLATE, FERRITIN, TIBC, IRON, RETICCTPCT in the last 72 hours.   Radiology: ECHOCARDIOGRAM COMPLETE Result Date: 09/04/2024    ECHOCARDIOGRAM REPORT   Patient Name:   Craig [name] [facility] Date of  Exam: 09/03/2024 Medical Rec #:  [id number]   Height:       68.0 in Accession #:    [id number]  Weight:       153.8 lb Date of Birth:  1931-10-13    BSA:          1.828 m Patient Age:    89 years    BP:           138/126 mmHg Patient Gender: M           HR:           72 bpm. Exam Location:  [facility] Procedure: 2D Echo, Cardiac Doppler and Color Doppler (Both Spectral and Color            Flow Doppler were utilized during procedure). Indications:     Congestive heart failure I50.9  History:         Patient has no prior history of Echocardiogram examinations.                  Risk Factors:Hypertension, Dyslipidemia and Diabetes.  Sonographer:     Christopher Furnace Referring Phys:  [id number] [facility] Diagnosing Phys: Cara JONETTA Lovelace MD  Sonographer Comments: Technically challenging study due to limited acoustic windows and no apical window. Image acquisition challenging due to patient body habitus. IMPRESSIONS  1. Left ventricular ejection fraction, by estimation, is 30 to 35%. The left ventricle has moderately decreased function. The left ventricle demonstrates global hypokinesis. Left ventricular diastolic function could not be evaluated.  2. Right ventricular systolic function is normal. The right ventricular size is normal.  3. Left atrial size was mildly dilated.  4. Right atrial size was mildly dilated.  5. The mitral valve is grossly normal. Mild mitral valve regurgitation.  6. Tricuspid valve regurgitation is mild to moderate.  7. The aortic valve is normal in structure. Aortic valve regurgitation is not visualized. FINDINGS  Left Ventricle: Left ventricular ejection fraction, by estimation,  is 30 to 35%. The left ventricle has moderately decreased function. The left ventricle demonstrates global hypokinesis. Strain was performed and the global longitudinal strain is indeterminate. The left ventricular internal cavity size was normal in size. There is no left ventricular hypertrophy. Left ventricular diastolic function  could not be evaluated. Right Ventricle: The right ventricular size is normal. No increase in right ventricular wall thickness. Right ventricular systolic function is normal. Left Atrium: Left atrial size was mildly dilated. Right Atrium: Right atrial size was mildly dilated. Pericardium: There is no evidence of pericardial effusion. Mitral Valve: The mitral valve is grossly normal. Mild mitral valve regurgitation. Tricuspid Valve: The tricuspid valve is normal in structure. Tricuspid valve regurgitation is mild to moderate. Aortic Valve: The aortic valve is normal in structure. Aortic valve regurgitation is not visualized. Pulmonic Valve: The pulmonic valve was grossly normal. Pulmonic valve regurgitation is trivial. Aorta: The ascending aorta was not well visualized. IAS/Shunts: No atrial level shunt detected by color flow Doppler. Additional Comments: 3D was performed not requiring image post processing on an independent workstation and was indeterminate.  LEFT VENTRICLE PLAX 2D LVIDd:         4.60 cm LVIDs:         3.90 cm LV PW:         1.00 cm LV IVS:        1.10 cm LVOT diam:     2.00 cm LVOT Area:     3.14 cm  LEFT ATRIUM         Index LA diam:    4.30 cm 2.35 cm/m   AORTA Ao Root diam: 3.20 cm TRICUSPID VALVE TR Peak grad:   43.6 mmHg TR Vmax:        330.00 cm/s  SHUNTS Systemic Diam: 2.00 cm Cara JONETTA Lovelace MD Electronically signed by Cara JONETTA Lovelace MD Signature Date/Time: 09/04/2024/8:07:09 PM    Final    DG Chest Port 1 View Result Date: 09/03/2024 EXAM: 1 VIEW(S) XRAY OF THE CHEST 09/03/2024 08:12:00 AM COMPARISON: Chest x-ray 02/11/2011. CLINICAL HISTORY: Leg swelling. FINDINGS: LUNGS AND PLEURA: Low lung volumes. Opacification of the right base likely small to moderate effusion with associated basal atelectasis, although infection is possible. Suggestion of mild associated vascular congestion. No pneumothorax. HEART AND MEDIASTINUM: Mild cardiomegaly. Atherosclerotic calcifications.  Calcification of the mitral valve annulus. BONES AND SOFT TISSUES: Multilevel degenerative changes of thoracic spine. IMPRESSION: 1. Right basilar opacification most consistent with small to moderate pleural effusion with associated atelectasis; superimposed infection is possible. 2. Mild cardiomegaly with atherosclerotic vascular calcifications and mitral annular calcification. Electronically signed by: Toribio Agreste MD 09/03/2024 08:18 AM EST RP Workstation: [id number]    ECHO as above  TELEMETRY (personally reviewed): Atrial fibrillation rate 60s, PVCs  EKG (personally reviewed): atrial fibrillation PVC rate 77 bpm  Data reviewed by me 09/05/2024: last 24h vitals tele labs imaging I/O ED provider note, admission H&P, hospitalist progress note  Principal Problem:   Acute on chronic systolic CHF (congestive heart failure) (HCC) Active Problems:   HTN (hypertension)   PAF (paroxysmal atrial fibrillation) (HCC)   Type 2 diabetes mellitus with hyperlipidemia (HCC)   Hypokalemia    ASSESSMENT AND PLAN:  Craig [name] is a 89 y.o. male  with a past medical history of chronic HFmrEF, paroxysmal atrial fibrillation, moderate to severe MR, hypertension, hyperlipidemia, type II diabetes, CKD stage IIIa who presented to the ED on 09/03/2024 for leg swelling. Cardiology was consulted for further evaluation.   #  Acute on chronic HFmrEF # Persistent atrial fibrillation # Valvular heart disease # Hypertension Patient with worsening SOB and LE edema for 2-3 weeks. BNP significantly elevated 11,000. EKG with AF, rate controlled. Started on IV lasix  in the ED. Echo this admission with EF 30-35%, global hypokinesis, mild MR, mild to moderate TR.  -Continue IV lasix  40 mg twice daily.  -Increase spironolactone  to 25 mg daily. Continue home lisinopril  30 mg.  -Continue home eliquis  2.5 mg twice daily for stroke risk reduction.  -Continue home metoprolol  succinate 12.5 mg daily.  -Minimal trops most  consistent with demand/supply mismatch and not ACS in setting of AoCHF.   This patient's plan of care was discussed and created with Dr. Florencio and he is in agreement.  Signed: Danita Bloch, PA-C  09/05/2024, 8:06 AM [facility]

## 2024-09-06 DIAGNOSIS — I5023 Acute on chronic systolic (congestive) heart failure: Secondary | ICD-10-CM | POA: Diagnosis not present

## 2024-09-06 LAB — BASIC METABOLIC PANEL WITH GFR
Anion gap: 13 (ref 5–15)
BUN: 32 mg/dL — ABNORMAL HIGH (ref 8–23)
CO2: 30 mmol/L (ref 22–32)
Calcium: 9 mg/dL (ref 8.9–10.3)
Chloride: 98 mmol/L (ref 98–111)
Creatinine, Ser: 1.4 mg/dL — ABNORMAL HIGH (ref 0.61–1.24)
GFR, Estimated: 48 mL/min — ABNORMAL LOW (ref >60)
Glucose, Bld: 97 mg/dL (ref 70–99)
Potassium: 3.9 mmol/L (ref 3.5–5.1)
Sodium: 140 mmol/L (ref 135–145)

## 2024-09-06 LAB — GLUCOSE, CAPILLARY
Glucose-Capillary: 103 mg/dL — ABNORMAL HIGH (ref 70–99)
Glucose-Capillary: 142 mg/dL — ABNORMAL HIGH (ref 70–99)
Glucose-Capillary: 185 mg/dL — ABNORMAL HIGH (ref 70–99)

## 2024-09-06 LAB — MAGNESIUM: Magnesium: 2.2 mg/dL (ref 1.7–2.4)

## 2024-09-06 MED ORDER — POTASSIUM CHLORIDE CRYS ER 20 MEQ PO TBCR
20.0000 meq | EXTENDED_RELEASE_TABLET | Freq: Once | ORAL | Status: AC
  Administered 2024-09-06: 20 meq via ORAL
  Filled 2024-09-06: qty 1

## 2024-09-06 NOTE — Progress Notes (Signed)
 Patient ID: Craig [name], male   DOB: 1932-08-09, 89 y.o.   MRN: [id number] [facility] Cardiology    SUBJECTIVE: Resting comfortably in bed no chest pain no real shortness of breath still feels slightly weak but states that his dyspnea is much improved   Vitals:   09/06/24 0509 09/06/24 0745 09/06/24 0936 09/06/24 1205  BP:  133/88 (!) 124/93 132/83  Pulse:  69  68  Resp:      Temp:  98 F (36.7 C)  97.6 F (36.4 C)  TempSrc:      SpO2:  94%  100%  Weight: 62.9 kg     Height:         Intake/Output Summary (Last 24 hours) at 09/06/2024 1309 Last data filed at 09/06/2024 1132 Gross per 24 hour  Intake 390 ml  Output 2675 ml  Net -2285 ml      PHYSICAL EXAM  General: Well developed, well nourished, in no acute distress HEENT:  Normocephalic and atramatic Neck:  No JVD.  Lungs: Clear bilaterally to auscultation and percussion. Heart: HRRR . Normal S1 and S2 without gallops or murmurs.  Abdomen: Bowel sounds are positive, abdomen soft and non-tender  Msk:  Back normal, normal gait. Normal strength and tone for age. Extremities: No clubbing, cyanosis or edema.   Neuro: Alert and oriented X 3. Psych:  Good affect, responds appropriately   LABS: Basic Metabolic Panel: Recent Labs    09/05/24 0417 09/06/24 0536  NA 141 140  K 3.8 3.9  CL 99 98  CO2 30 30  GLUCOSE 110* 97  BUN 27* 32*  CREATININE 1.20 1.40*  CALCIUM  8.6* 9.0  MG 2.2 2.2  PHOS 2.7  --    Liver Function Tests: Recent Labs    09/04/24 0422  AST 29  ALT 11  ALKPHOS 170*  BILITOT 2.2*  PROT 6.1*  ALBUMIN 3.4*   No results for input(s): LIPASE, AMYLASE in the last 72 hours. CBC: Recent Labs    09/04/24 0422  WBC 5.7  HGB 13.5  HCT 41.1  MCV 96.0  PLT 91*   Cardiac Enzymes: No results for input(s): CKTOTAL, CKMB, CKMBINDEX, TROPONINI in the last 72 hours. BNP: Invalid input(s): POCBNP D-Dimer: No results for input(s): DDIMER in the last 72 hours. Hemoglobin A1C: No results  for input(s): HGBA1C in the last 72 hours. Fasting Lipid Panel: No results for input(s): CHOL, HDL, LDLCALC, TRIG, CHOLHDL, LDLDIRECT in the last 72 hours. Thyroid  Function Tests: No results for input(s): TSH, T4TOTAL, T3FREE, THYROIDAB in the last 72 hours.  Invalid input(s): FREET3 Anemia Panel: No results for input(s): VITAMINB12, FOLATE, FERRITIN, TIBC, IRON, RETICCTPCT in the last 72 hours.  No results found.   Echo moderate to severely depressed left ventricular function EF around 30 to 35%  TELEMETRY: :  ASSESSMENT AND PLAN:  Principal Problem:   Acute on chronic systolic CHF (congestive heart failure) (HCC) Active Problems:   HTN (hypertension)   PAF (paroxysmal atrial fibrillation) (HCC)   Type 2 diabetes mellitus with hyperlipidemia (HCC)   Hypokalemia   Plan  HFrEF EF 30 to 35% recommend GDMT with IV Lasix  beta-blocker ACE inhibitor and spironolactone  consider SGLT2 Hypertension reasonably controlled continue Toprol  lisinopril  spironolactone  Lasix  as necessary for diuresis Paroxysmal atrial fibrillation metoprolol  for rate Eliquis  for anticoagulation Diabetes reasonably controlled A1c less than 7 consider SGLT2 continue diet and exercise Hypokalemia hypomagnesemia recommend correct electrolytes Increase activity with physical therapy to ambulate in halls up out of bed to chair  Do not necessary recommend AICD until his EF is less than 35% with his current comorbid condition I do not believe it would be of much benefit      Cara JONETTA Lovelace, MD,  09/06/2024 1:09 PM

## 2024-09-06 NOTE — Progress Notes (Signed)
 PHARMACY CONSULT NOTE - FOLLOW UP  Pharmacy Consult for Electrolyte Monitoring and Replacement   Recent Labs: Potassium (mmol/L)  Date Value  09/06/2024 3.9  01/31/2013 4.2   Magnesium  (mg/dL)  Date Value  87/93/7974 2.2   Calcium  (mg/dL)  Date Value  87/93/7974 9.0   Calcium , Total (mg/dL)  Date Value  94/97/7985 8.5   Albumin (g/dL)  Date Value  87/95/7974 3.4 (L)  01/29/2013 2.9 (L)   Phosphorus (mg/dL)  Date Value  87/94/7974 2.7   Sodium (mmol/L)  Date Value  09/06/2024 140  01/31/2013 142   Assessment: HFmrEF, paroxysmal atrial fibrillation, moderate to severe MR, hypertension, hyperlipidemia, type II diabetes, CKD stage III . Patient admitted with acute HF and started on furosemide  IV. Pharmacy consulted to manage electrolytes while inpatient.   Goal of Therapy:  K>4, Mg >2, and all other electrolytes WNL  Plan:  - Will order KCL PO 20 meq po x 1 dose. - Check renal function panel and magnesium  with AM labs. - Pharmacy will continue to monitor and replace as needed.    Estill CHRISTELLA Lutes, PharmD, BCPS Clinical Pharmacist 09/06/2024 7:39 AM

## 2024-09-06 NOTE — Progress Notes (Addendum)
  PROGRESS NOTE    Craig [name]  FMW:[id number] DOB: [birth year] DOA: 09/03/2024 PCP: Alla Amis, MD  [id number]  LOS: 3 days   Brief hospital course:   Assessment & Plan: Craig [name] was admitted to the hospital with the working diagnosis of heart failure exacerbation.    89 yo male with the past medical history of heart failure, paroxysmal atrial fibrillation, hypertension, hyperlipidemia, CKD and T2DM who presented with dyspnea and lower extremity edema.  Reported worsening symptoms for the last 3 weeks prior to admission. He contacted his cardiologist and was instructed to increase the dose of his diuretic. Unfortunately his symptoms continue to worsen, prompting him to come to the ED.   * Acute on chronic systolic CHF (congestive heart failure) (HCC) --current Echo LVEF 30-35%. --CXR showed Right basilar opacification most consistent with small to moderate pleural effusion with associated atelectasis --started on IV lasix  40 BID, cardio consulted --hold IV lasix  due to Cr increase --cont Toprol , Lisinopril  --cont aldactone  (new)   HTN (hypertension) --hold IV lasix  due to Cr increase --cont Toprol , Lisinopril  --cont aldactone  (new)   PAF (paroxysmal atrial fibrillation) (HCC) --cont Toprol  --cont Eliquis    Hypokalemia Hypomag --monitor and supplement PRN   Type 2 diabetes mellitus with hyperlipidemia (HCC) --A1c 6.7 --no need for BG checks    DVT prophylaxis: On:Eliquis  Code Status: DNR  Family Communication: grandson updated on the phone today Level of care: Telemetry Dispo:   The patient is from: home Anticipated d/c is to: home Anticipated d/c date is: 1-2 days   Subjective and Interval History:  D/c'ed IV lasix  today due to Cr increase.  Pt had no new complaint today.   Objective: Vitals:   09/06/24 0745 09/06/24 0936 09/06/24 1205 09/06/24 1655  BP: 133/88 (!) 124/93 132/83 130/80  Pulse: 69  68 66  Resp:   17 16  Temp: 98 F (36.7 C)   97.6 F (36.4 C) 98.2 F (36.8 C)  TempSrc:   Oral Oral  SpO2: 94%  100%   Weight:      Height:        Intake/Output Summary (Last 24 hours) at 09/06/2024 1847 Last data filed at 09/06/2024 1623 Gross per 24 hour  Intake 390 ml  Output 3200 ml  Net -2810 ml   Filed Weights   09/04/24 0505 09/05/24 0508 09/06/24 0509  Weight: 71 kg 72.5 kg 62.9 kg    Examination:   Constitutional: NAD, alert, oriented HEENT: hard of hearing CV: No cyanosis.   RESP: normal respiratory effort, on RA Neuro: II - XII grossly intact.     Data Reviewed: I have personally reviewed labs and imaging studies  Time spent: 35 minutes  Craig Haber, MD [facility] If 7PM-7AM, please contact night-coverage 09/06/2024, 6:47 PM

## 2024-09-07 LAB — RENAL FUNCTION PANEL
Albumin: 3.3 g/dL — ABNORMAL LOW (ref 3.5–5.0)
Anion gap: 12 (ref 5–15)
BUN: 33 mg/dL — ABNORMAL HIGH (ref 8–23)
CO2: 27 mmol/L (ref 22–32)
Calcium: 9.2 mg/dL (ref 8.9–10.3)
Chloride: 97 mmol/L — ABNORMAL LOW (ref 98–111)
Creatinine, Ser: 1.35 mg/dL — ABNORMAL HIGH (ref 0.61–1.24)
GFR, Estimated: 50 mL/min — ABNORMAL LOW (ref >60)
Glucose, Bld: 134 mg/dL — ABNORMAL HIGH (ref 70–99)
Phosphorus: 3.4 mg/dL (ref 2.5–4.6)
Potassium: 4.3 mmol/L (ref 3.5–5.1)
Sodium: 137 mmol/L (ref 135–145)

## 2024-09-07 LAB — GLUCOSE, CAPILLARY
Glucose-Capillary: 126 mg/dL — ABNORMAL HIGH (ref 70–99)
Glucose-Capillary: 217 mg/dL — ABNORMAL HIGH (ref 70–99)

## 2024-09-07 MED ORDER — FUROSEMIDE 40 MG PO TABS
40.0000 mg | ORAL_TABLET | Freq: Every day | ORAL | Status: DC
  Administered 2024-09-07: 40 mg via ORAL
  Filled 2024-09-07: qty 1

## 2024-09-07 MED ORDER — FUROSEMIDE 20 MG PO TABS: 40.0000 mg | ORAL_TABLET | Freq: Every day | ORAL | 0 refills | Status: AC

## 2024-09-07 MED ORDER — EMPAGLIFLOZIN 10 MG PO TABS
10.0000 mg | ORAL_TABLET | Freq: Every day | ORAL | Status: DC
  Administered 2024-09-07: 10 mg via ORAL
  Filled 2024-09-07: qty 1

## 2024-09-07 MED ORDER — EMPAGLIFLOZIN 10 MG PO TABS: 10.0000 mg | ORAL_TABLET | Freq: Every day | ORAL | 2 refills | Status: AC

## 2024-09-07 MED ORDER — SPIRONOLACTONE 25 MG PO TABS: 25.0000 mg | ORAL_TABLET | Freq: Every day | ORAL | 2 refills | Status: AC

## 2024-09-07 NOTE — Progress Notes (Signed)
 Patient ID: Craig [name], male   DOB: Nov 04, 1931, 89 y.o.   MRN: [id number] [facility]    SUBJECTIVE: Patient feels reasonably well sitting up in chair no shortness of breath no chest pain.  Patient ambulated briefly today and feels reasonably well   Vitals:   09/07/24 0500 09/07/24 0608 09/07/24 0840 09/07/24 1156  BP:  134/80 (!) 133/101 112/79  Pulse:  66 69 (!) 53  Resp:   20 18  Temp:   (!) 97.5 F (36.4 C) (!) 97.4 F (36.3 C)  TempSrc:      SpO2:  97% 99% 99%  Weight: 69.4 kg     Height:         Intake/Output Summary (Last 24 hours) at 09/07/2024 1400 Last data filed at 09/07/2024 0932 Gross per 24 hour  Intake 990 ml  Output 1725 ml  Net -735 ml      PHYSICAL EXAM  General: Well developed, well nourished, in no acute distress HEENT:  Normocephalic and atramatic Neck:  No JVD.  Lungs: Clear bilaterally to auscultation and percussion. Heart: Irregular irregular. Normal S1 and S2 without gallops or murmurs.  Abdomen: Bowel sounds are positive, abdomen soft and non-tender  Msk:  Back normal, normal gait. Normal strength and tone for age. Extremities: No clubbing, cyanosis or edema.   Neuro: Alert and oriented X 3. Psych:  Good affect, responds appropriately   LABS: Basic Metabolic Panel: Recent Labs    09/05/24 0417 09/06/24 0536 09/07/24 0629  NA 141 140 137  K 3.8 3.9 4.3  CL 99 98 97*  CO2 30 30 27   GLUCOSE 110* 97 134*  BUN 27* 32* 33*  CREATININE 1.20 1.40* 1.35*  CALCIUM  8.6* 9.0 9.2  MG 2.2 2.2  --   PHOS 2.7  --  3.4   Liver Function Tests: Recent Labs    09/07/24 0629  ALBUMIN 3.3*   No results for input(s): LIPASE, AMYLASE in the last 72 hours. CBC: No results for input(s): WBC, NEUTROABS, HGB, HCT, MCV, PLT in the last 72 hours. Cardiac Enzymes: No results for input(s): CKTOTAL, CKMB, CKMBINDEX, TROPONINI in the last 72 hours. BNP: Invalid input(s): POCBNP D-Dimer: No results for input(s): DDIMER in  the last 72 hours. Hemoglobin A1C: No results for input(s): HGBA1C in the last 72 hours. Fasting Lipid Panel: No results for input(s): CHOL, HDL, LDLCALC, TRIG, CHOLHDL, LDLDIRECT in the last 72 hours. Thyroid  Function Tests: No results for input(s): TSH, T4TOTAL, T3FREE, THYROIDAB in the last 72 hours.  Invalid input(s): FREET3 Anemia Panel: No results for input(s): VITAMINB12, FOLATE, FERRITIN, TIBC, IRON, RETICCTPCT in the last 72 hours.  No results found.   Echo moderate severely reduced left ventricular function EF of 30 to 35%  TELEMETRY: :  ASSESSMENT AND PLAN:  Principal Problem:   Acute on chronic systolic CHF (congestive heart failure) (HCC) Active Problems:   HTN (hypertension)   PAF (paroxysmal atrial fibrillation) (HCC)   Type 2 diabetes mellitus with hyperlipidemia (HCC)   Hypokalemia    Plan GDMT for HFrEF with lisinopril  spironolactone  metoprolol  Jardiance  Continue diuretic therapy with Lasix  40 mg a day p.o. Paroxysmal atrial fibrillation rate controlled metoprolol  Eliquis  for anticoagulation Continue diabetes management and control adding SGLT2 goal of A1c less than 7 Correct electrolytes potassium and magnesium  Patient would likely benefit from AICD even device EF is less than 35% Continue medical therapy for heart failure symptoms Patient should be safe outpatient discharge follow-up with cardiology within 1 week  Craig [name]  JONETTA Lovelace, MD, 09/07/2024 2:00 PM

## 2024-09-07 NOTE — Progress Notes (Signed)
 Physical Therapy Treatment Patient Details Name: Craig [name] MRN: [id number] DOB: 1931/12/04 Today's Date: 09/07/2024   History of Present Illness Pt admitted with CHF exacerbation. PMH significant for CHF, paroxysmal atrial fibrillation, HTN, hyperlipidemia, CKD and T2DM.    PT Comments  Pt ready for session.  Stated he may be going home today.  Stands to RW with cga x 1.  He is able to complete x 1 lap on unit with RW and cga x 1.  He does have one LOB recovered with light min a towards end of walk.  Pt encouraged to have +1 assist at home upon discharge for general safety.  Pt encouraged to use RW at home vs SPC.   If plan is discharge home, recommend the following: A little help with walking and/or transfers;A little help with bathing/dressing/bathroom;Assist for transportation;Help with stairs or ramp for entrance;Assistance with cooking/housework   Can travel by Training And Development Officer (2 wheels)    Recommendations for Other Services       Precautions / Restrictions Precautions Precautions: Fall Restrictions Weight Bearing Restrictions Per Provider Order: No     Mobility  Bed Mobility               General bed mobility comments: NT pt recieved up in recliner Patient Response: Cooperative  Transfers Overall transfer level: Needs assistance Equipment used: Rolling walker (2 wheels) Transfers: Sit to/from Stand Sit to Stand: Contact guard assist                Ambulation/Gait Ambulation/Gait assistance: Contact guard assist Gait Distance (Feet): 220 Feet Assistive device: Rolling walker (2 wheels) Gait Pattern/deviations: Step-through pattern, Decreased step length - right, Decreased step length - left, Trunk flexed Gait velocity: dec     General Gait Details: able to complete lap with one LOB at end of gait recovered with light min a   Stairs             Wheelchair Mobility     Tilt Bed Tilt  Bed Patient Response: Cooperative  Modified Rankin (Stroke Patients Only)       Balance Overall balance assessment: Needs assistance Sitting-balance support: No upper extremity supported, Feet supported       Standing balance support: Bilateral upper extremity supported Standing balance-Leahy Scale: Fair                              Hotel Manager: Impaired Factors Affecting Communication: Hearing impaired  Cognition Arousal: Alert Behavior During Therapy: WFL for tasks assessed/performed   PT - Cognitive impairments: No apparent impairments                         Following commands: Intact      Cueing Cueing Techniques: Verbal cues  Exercises      General Comments        Pertinent Vitals/Pain Pain Assessment Pain Assessment: No/denies pain    Home Living                          Prior Function            PT Goals (current goals can now be found in the care plan section) Progress towards PT goals: Progressing toward goals    Frequency    Min 2X/week  PT Plan      Co-evaluation              AM-PAC PT 6 Clicks Mobility   Outcome Measure  Help needed turning from your back to your side while in a flat bed without using bedrails?: None Help needed moving from lying on your back to sitting on the side of a flat bed without using bedrails?: A Little Help needed moving to and from a bed to a chair (including a wheelchair)?: A Little Help needed standing up from a chair using your arms (e.g., wheelchair or bedside chair)?: A Little Help needed to walk in hospital room?: A Little Help needed climbing 3-5 steps with a railing? : A Little 6 Click Score: 19    End of Session Equipment Utilized During Treatment: Gait belt Activity Tolerance: Patient tolerated treatment well Patient left: in chair;with call bell/phone within reach;with chair alarm set Nurse Communication: Mobility  status PT Visit Diagnosis: Muscle weakness (generalized) (M62.81);Difficulty in walking, not elsewhere classified (R26.2)     Time: 8669-8661 PT Time Calculation (min) (ACUTE ONLY): 8 min  Charges:    $Gait Training: 8-22 mins PT General Charges $$ ACUTE PT VISIT: 1 Visit                   Lauraine Gills, PTA 09/07/24, 2:07 PM

## 2024-09-07 NOTE — TOC Transition Note (Addendum)
 Transition of Care [facility]) - Discharge Note   Patient Details  Name: Craig [name] MRN: [id number] Date of Birth: 1932/08/04  Transition of Care [facility] CM/SW Contact:  Victory Jackquline RAMAN, RN Phone Number: 09/07/2024, 4:27 PM   Clinical Narrative:  Patient discharging home with HH/PT/OT/RN. Patient accepted [facility] and notification sent to [facility] letting them know that he was discharging today. BSC was delivered and patient's grandson took it home. Per MD, patient is driving himself home. No further concerns. RNCM signing off.   Final next level of care: Home w Home Health Services Barriers to Discharge: Barriers Resolved   Patient Goals and CMS Choice Patient states their goals for this hospitalization and ongoing recovery are:: to get back home and back to feeling better CMS Medicare.gov Compare Post Acute Care list provided to:: Patient Choice offered to / list presented to : Patient      Discharge Placement                Patient to be transferred to facility by: SELF Name of family member notified: Alm Patient and family notified of of transfer: 09/07/24  Discharge Plan and Services Additional resources added to the After Visit Summary for     Discharge Planning Services: CM Consult Post Acute Care Choice: Home Health          DME Arranged: 3-N-1 DME Agency: [employer] Date DME Agency Contacted: 09/05/24   Representative spoke with at DME Agency: [facility] HH Arranged: PT, OT, RN [facility] Agency: [facility] Date [facility] Agency Contacted: 09/05/24      Social Drivers of Health (SDOH) Interventions SDOH Screenings   Food Insecurity: No Food Insecurity (09/03/2024)  Housing: Low Risk  (09/03/2024)  Transportation Needs: No Transportation Needs (09/03/2024)  Utilities: Not At Risk (09/03/2024)  Financial Resource Strain: Low Risk  (10/10/2023)   Received from [facility]  Social Connections: Socially Isolated (09/03/2024)  Tobacco Use: Low Risk  (09/03/2024)      Readmission Risk Interventions     No data to display

## 2024-09-07 NOTE — Discharge Summary (Signed)
 Physician Discharge Summary   Craig [name]  male DOB: 20-Apr-1932  FMW:[id number]  PCP: Alla Amis, MD  Admit date: 09/03/2024 Discharge date: ***  Admitted From: home Disposition:  home Home Health: Yes CODE STATUS: DNR  Discharge Instructions     Diet - low sodium heart healthy   Complete by: As directed    Discharge instructions   Complete by: As directed    Cardiologist has increased your lasix  from 20 to 40 mg daily, also added Jardiance  and Aldactone .    Followup with cardiology in outpatient clinic. - -   Discharge wound care:   Complete by: As directed    right posterior leg: Cleanse wound with water, pat dry. Apply Xeroform to wound bed and cover with silicone foam dressing.  Change daily. [facility] Course:  For full details, please see H&P, progress notes, consult notes and ancillary notes.  Briefly,  ***  Unless noted above, medications under STOP list are ones pt was not taking PTA.  Discharge Diagnoses:  Principal Problem:   Acute on chronic systolic CHF (congestive heart failure) (HCC) Active Problems:   HTN (hypertension)   PAF (paroxysmal atrial fibrillation) (HCC)   Hypokalemia   Type 2 diabetes mellitus with hyperlipidemia (HCC)   30 Day Unplanned Readmission Risk Score    Flowsheet Row ED to Hosp-Admission (Current) from 09/03/2024 in [facility] PCU  30 Day Unplanned Readmission Risk Score (%) 18.32 Filed at 09/07/2024 1200    This score is the patient's risk of an unplanned readmission within 30 days of being discharged (0 -100%). The score is based on dignosis, age, lab data, medications, orders, and past utilization.   Low:  0-14.9   Medium: 15-21.9   High: 22-29.9   Extreme: 30 and above         Discharge Instructions:  Allergies as of 09/07/2024       Reactions   Statins Other (See Comments)   Cramps        Medication List     STOP taking these medications    hydrochlorothiazide  12.5 MG capsule Commonly known as: MICROZIDE       TAKE these medications    apixaban  2.5 MG Tabs tablet Commonly known as: ELIQUIS  Take 2.5 mg by mouth 2 (two) times daily.   empagliflozin  10 MG Tabs tablet Commonly known as: JARDIANCE  Take 1 tablet (10 mg total) by mouth daily.   furosemide  20 MG tablet Commonly known as: LASIX  Take 2 tablets (40 mg total) by mouth daily. What changed: how much to take   lisinopril  30 MG tablet Commonly known as: ZESTRIL  Take 30 mg by mouth daily.   metFORMIN 500 MG tablet Commonly known as: GLUCOPHAGE Take 500 mg by mouth every morning.   metoprolol  succinate 25 MG 24 hr tablet Commonly known as: TOPROL -XL Take 12.5 mg by mouth every morning.   neomycin-polymyxin b-dexamethasone  3.5-10000-0.1 Oint Commonly known as: MAXITROL Place 1 Application into both eyes daily.   rosuvastatin  5 MG tablet Commonly known as: CRESTOR  Take 2.5 mg by mouth every Monday, Wednesday, and Friday. pm   spironolactone  25 MG tablet Commonly known as: ALDACTONE  Take 1 tablet (25 mg total) by mouth daily. Start taking on: September 08, 2024   terazosin  10 MG capsule Commonly known as: HYTRIN  Take 10 mg by mouth at bedtime.   timolol  0.5 % ophthalmic solution Commonly known as: BETIMOL  Place 1 drop into the right eye daily.  Durable Medical Equipment  (From admission, onward)           Start     Ordered   09/05/24 1645  For home use only DME Bedside commode  Once       Question:  Patient needs a bedside commode to treat with the following condition  Answer:  Weakness   09/05/24 1644              Discharge Care Instructions  (From admission, onward)           Start     Ordered   09/07/24 0000  Discharge wound care:       Comments: right posterior leg: Cleanse wound with water, pat dry. Apply Xeroform to wound bed and cover with silicone foam dressing.  Change daily. - -   09/07/24 1449              Contact information for follow-up providers     Clarisa Kung, PA-C. Go in 1 week(s).   Contact information: [address] [facility] [place] [address] [phone]              Contact information for after-discharge care     Home Medical Care     [facility]) .   Service: Home Health Services Contact information: [address] [place]  [address] [phone]                     Allergies  Allergen Reactions   Statins Other (See Comments)    Cramps      The results of significant diagnostics from this hospitalization (including imaging, microbiology, ancillary and laboratory) are listed below for reference.   Consultations:   Procedures/Studies: ECHOCARDIOGRAM COMPLETE Result Date: 09/04/2024    ECHOCARDIOGRAM REPORT   Patient Name:   Craig [name] [facility] Date of Exam: 09/03/2024 Medical Rec #:  [id number]   Height:       68.0 in Accession #:    [id number]  Weight:       153.8 lb Date of Birth:  1932-06-04    BSA:          1.828 m Patient Age:    89 years    BP:           138/126 mmHg Patient Gender: M           HR:           72 bpm. Exam Location:  [facility] Procedure: 2D Echo, Cardiac Doppler and Color Doppler (Both Spectral and Color            Flow Doppler were utilized during procedure). Indications:     Congestive heart failure I50.9  History:         Patient has no prior history of Echocardiogram examinations.                  Risk Factors:Hypertension, Dyslipidemia and Diabetes.  Sonographer:     Christopher Furnace Referring Phys:  [id number] [facility] PAUDEL Diagnosing Phys: Cara JONETTA Lovelace MD  Sonographer Comments: Technically challenging study due to limited acoustic windows and no apical window. Image acquisition challenging due to patient body habitus. IMPRESSIONS  1. Left ventricular ejection fraction, by estimation, is 30 to 35%. The left ventricle has moderately decreased function. The left ventricle  demonstrates global hypokinesis. Left ventricular diastolic function could not be evaluated.  2. Right ventricular systolic function is normal. The  right ventricular size is normal.  3. Left atrial size was mildly dilated.  4. Right atrial size was mildly dilated.  5. The mitral valve is grossly normal. Mild mitral valve regurgitation.  6. Tricuspid valve regurgitation is mild to moderate.  7. The aortic valve is normal in structure. Aortic valve regurgitation is not visualized. FINDINGS  Left Ventricle: Left ventricular ejection fraction, by estimation, is 30 to 35%. The left ventricle has moderately decreased function. The left ventricle demonstrates global hypokinesis. Strain was performed and the global longitudinal strain is indeterminate. The left ventricular internal cavity size was normal in size. There is no left ventricular hypertrophy. Left ventricular diastolic function could not be evaluated. Right Ventricle: The right ventricular size is normal. No increase in right ventricular wall thickness. Right ventricular systolic function is normal. Left Atrium: Left atrial size was mildly dilated. Right Atrium: Right atrial size was mildly dilated. Pericardium: There is no evidence of pericardial effusion. Mitral Valve: The mitral valve is grossly normal. Mild mitral valve regurgitation. Tricuspid Valve: The tricuspid valve is normal in structure. Tricuspid valve regurgitation is mild to moderate. Aortic Valve: The aortic valve is normal in structure. Aortic valve regurgitation is not visualized. Pulmonic Valve: The pulmonic valve was grossly normal. Pulmonic valve regurgitation is trivial. Aorta: The ascending aorta was not well visualized. IAS/Shunts: No atrial level shunt detected by color flow Doppler. Additional Comments: 3D was performed not requiring image post processing on an independent workstation and was indeterminate.  LEFT VENTRICLE PLAX 2D LVIDd:         4.60 cm LVIDs:         3.90 cm LV PW:          1.00 cm LV IVS:        1.10 cm LVOT diam:     2.00 cm LVOT Area:     3.14 cm  LEFT ATRIUM         Index LA diam:    4.30 cm 2.35 cm/m   AORTA Ao Root diam: 3.20 cm TRICUSPID VALVE TR Peak grad:   43.6 mmHg TR Vmax:        330.00 cm/s  SHUNTS Systemic Diam: 2.00 cm Cara JONETTA Lovelace MD Electronically signed by Cara JONETTA Lovelace MD Signature Date/Time: 09/04/2024/8:07:09 PM    Final    DG Chest Port 1 View Result Date: 09/03/2024 EXAM: 1 VIEW(S) XRAY OF THE CHEST 09/03/2024 08:12:00 AM COMPARISON: Chest x-ray 02/11/2011. CLINICAL HISTORY: Leg swelling. FINDINGS: LUNGS AND PLEURA: Low lung volumes. Opacification of the right base likely small to moderate effusion with associated basal atelectasis, although infection is possible. Suggestion of mild associated vascular congestion. No pneumothorax. HEART AND MEDIASTINUM: Mild cardiomegaly. Atherosclerotic calcifications. Calcification of the mitral valve annulus. BONES AND SOFT TISSUES: Multilevel degenerative changes of thoracic spine. IMPRESSION: 1. Right basilar opacification most consistent with small to moderate pleural effusion with associated atelectasis; superimposed infection is possible. 2. Mild cardiomegaly with atherosclerotic vascular calcifications and mitral annular calcification. Electronically signed by: Toribio Agreste MD 09/03/2024 08:18 AM EST RP Workstation: [id number]      Labs: BNP (last 3 results) No results for input(s): BNP in the last 8760 hours. Basic Metabolic Panel: Recent Labs  Lab 09/03/24 0806 09/04/24 0422 09/04/24 1558 09/05/24 0417 09/06/24 0536 09/07/24 0629  NA 144 141  --  141 140 137  K 3.0* 2.5* 3.7 3.8 3.9 4.3  CL 104 97*  --  99 98 97*  CO2 28 32  --  30 30  27  GLUCOSE 146* 112*  --  110* 97 134*  BUN 26* 24*  --  27* 32* 33*  CREATININE 1.14 1.19  --  1.20 1.40* 1.35*  CALCIUM  8.5* 8.5*  --  8.6* 9.0 9.2  MG  --   --  1.7 2.2 2.2  --   PHOS  --   --   --  2.7  --  3.4   Liver Function  Tests: Recent Labs  Lab 09/03/24 0806 09/04/24 0422 09/07/24 0629  AST 29 29  --   ALT 10 11  --   ALKPHOS 175* 170*  --   BILITOT 2.1* 2.2*  --   PROT 6.2* 6.1*  --   ALBUMIN 3.4* 3.4* 3.3*   No results for input(s): LIPASE, AMYLASE in the last 168 hours. No results for input(s): AMMONIA in the last 168 hours. CBC: Recent Labs  Lab 09/03/24 0806 09/04/24 0422  WBC 5.9 5.7  NEUTROABS 4.1  --   HGB 13.6 13.5  HCT 42.7 41.1  MCV 99.3 96.0  PLT 85* 91*   Cardiac Enzymes: No results for input(s): CKTOTAL, CKMB, CKMBINDEX, TROPONINI in the last 168 hours. BNP: Invalid input(s): POCBNP CBG: Recent Labs  Lab 09/06/24 0748 09/06/24 1201 09/06/24 1628 09/07/24 0836 09/07/24 1158  GLUCAP 103* 142* 185* 126* 217*   D-Dimer No results for input(s): DDIMER in the last 72 hours. Hgb A1c No results for input(s): HGBA1C in the last 72 hours. Lipid Profile No results for input(s): CHOL, HDL, LDLCALC, TRIG, CHOLHDL, LDLDIRECT in the last 72 hours. Thyroid  function studies No results for input(s): TSH, T4TOTAL, T3FREE, THYROIDAB in the last 72 hours.  Invalid input(s): FREET3 Anemia work up No results for input(s): VITAMINB12, FOLATE, FERRITIN, TIBC, IRON, RETICCTPCT in the last 72 hours. Urinalysis    Component Value Date/Time   COLORURINE YELLOW (A) 09/03/2024 0957   APPEARANCEUR CLEAR (A) 09/03/2024 0957   LABSPEC 1.009 09/03/2024 0957   PHURINE 6.0 09/03/2024 0957   GLUCOSEU NEGATIVE 09/03/2024 0957   HGBUR NEGATIVE 09/03/2024 0957   BILIRUBINUR NEGATIVE 09/03/2024 0957   KETONESUR NEGATIVE 09/03/2024 0957   PROTEINUR NEGATIVE 09/03/2024 0957   NITRITE NEGATIVE 09/03/2024 0957   LEUKOCYTESUR NEGATIVE 09/03/2024 0957   Sepsis Labs Recent Labs  Lab 09/03/24 0806 09/04/24 0422  WBC 5.9 5.7   Microbiology No results found for this or any previous visit (from the past 240 hours).   Total time spend on  discharging this patient, including the last patient exam, discussing the hospital stay, instructions for ongoing care as it relates to all pertinent caregivers, as well as preparing the medical discharge records, prescriptions, and/or referrals as applicable, is *** minutes.    Ellouise Haber, MD  [facility] 09/07/2024, 2:50 PM

## 2024-09-07 NOTE — Progress Notes (Signed)
 PHARMACY CONSULT NOTE - FOLLOW UP  Pharmacy Consult for Electrolyte Monitoring and Replacement   Recent Labs: Potassium (mmol/L)  Date Value  09/07/2024 4.3  01/31/2013 4.2   Magnesium  (mg/dL)  Date Value  87/93/7974 2.2   Calcium  (mg/dL)  Date Value  87/92/7974 9.2   Calcium , Total (mg/dL)  Date Value  94/97/7985 8.5   Albumin (g/dL)  Date Value  87/92/7974 3.3 (L)  01/29/2013 2.9 (L)   Phosphorus (mg/dL)  Date Value  87/92/7974 3.4   Sodium (mmol/L)  Date Value  09/07/2024 137  01/31/2013 142   Assessment: HFmrEF, paroxysmal atrial fibrillation, moderate to severe MR, hypertension, hyperlipidemia, type II diabetes, CKD stage III . Patient admitted with acute HF and started on furosemide  IV. Pharmacy consulted to manage electrolytes while inpatient.   Goal of Therapy:  K>4, Mg >2, and all other electrolytes WNL  Plan:  - No replacement warranted today - Check renal function panel and magnesium  with AM labs. - Pharmacy will continue to monitor and replace as needed.    Craig [name], PharmD, BCPS Clinical Pharmacist 09/07/2024 7:39 AM

## 2024-09-09 NOTE — Progress Notes (Signed)
 09/09/2024 - 5 min

## 2024-09-10 NOTE — Progress Notes (Signed)
 09/10/2024 - 2 min

## 2024-09-18 ENCOUNTER — Other Ambulatory Visit: Payer: Self-pay | Admitting: Internal Medicine

## 2024-09-18 DIAGNOSIS — J90 Pleural effusion, not elsewhere classified: Secondary | ICD-10-CM
